# Patient Record
Sex: Female | Born: 2000 | Race: White | Hispanic: No | Marital: Single | Smoking: Never smoker
Health system: Southern US, Community
[De-identification: ages and names within clinical notes are randomized; demographics above are authoritative.]

## PROBLEM LIST (undated history)

## (undated) ENCOUNTER — Inpatient Hospital Stay (HOSPITAL_COMMUNITY): Payer: Self-pay

## (undated) DIAGNOSIS — Z9889 Other specified postprocedural states: Secondary | ICD-10-CM

## (undated) DIAGNOSIS — J45909 Unspecified asthma, uncomplicated: Secondary | ICD-10-CM

## (undated) DIAGNOSIS — R112 Nausea with vomiting, unspecified: Secondary | ICD-10-CM

## (undated) DIAGNOSIS — S0291XA Unspecified fracture of skull, initial encounter for closed fracture: Secondary | ICD-10-CM

## (undated) HISTORY — PX: INNER EAR SURGERY: SHX679

---
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
## 2019-01-08 DIAGNOSIS — Z3482 Encounter for supervision of other normal pregnancy, second trimester: Secondary | ICD-10-CM | POA: Diagnosis not present

## 2019-01-25 DIAGNOSIS — Z363 Encounter for antenatal screening for malformations: Secondary | ICD-10-CM | POA: Diagnosis not present

## 2019-03-21 ENCOUNTER — Encounter (HOSPITAL_COMMUNITY): Payer: Self-pay | Admitting: *Deleted

## 2019-03-21 ENCOUNTER — Inpatient Hospital Stay (HOSPITAL_COMMUNITY)
Admission: AD | Admit: 2019-03-21 | Discharge: 2019-03-21 | Disposition: A | Discharge status: Home / Self Care | Payer: Medicaid Other | Attending: Obstetrics and Gynecology | Admitting: Obstetrics and Gynecology

## 2019-03-21 ENCOUNTER — Other Ambulatory Visit: Payer: Self-pay

## 2019-03-21 DIAGNOSIS — Z3A27 27 weeks gestation of pregnancy: Secondary | ICD-10-CM | POA: Insufficient documentation

## 2019-03-21 DIAGNOSIS — B9689 Other specified bacterial agents as the cause of diseases classified elsewhere: Secondary | ICD-10-CM | POA: Insufficient documentation

## 2019-03-21 DIAGNOSIS — Z0371 Encounter for suspected problem with amniotic cavity and membrane ruled out: Secondary | ICD-10-CM | POA: Diagnosis not present

## 2019-03-21 DIAGNOSIS — O26892 Other specified pregnancy related conditions, second trimester: Secondary | ICD-10-CM | POA: Diagnosis not present

## 2019-03-21 LAB — WET PREP, GENITAL
Sperm: NONE SEEN
Trich, Wet Prep: NONE SEEN
Yeast Wet Prep HPF POC: NONE SEEN

## 2019-03-21 LAB — URINALYSIS, ROUTINE W REFLEX MICROSCOPIC
Bilirubin Urine: NEGATIVE
Glucose, UA: NEGATIVE mg/dL
Hgb urine dipstick: NEGATIVE
Ketones, ur: NEGATIVE mg/dL
Nitrite: NEGATIVE
Protein, ur: NEGATIVE mg/dL
Specific Gravity, Urine: 1.008 (ref 1.005–1.030)
pH: 6 (ref 5.0–8.0)

## 2019-03-21 LAB — POCT FERN TEST: POCT Fern Test: NEGATIVE

## 2019-03-21 LAB — AMNISURE RUPTURE OF MEMBRANE (ROM) NOT AT ARMC: Amnisure ROM: NEGATIVE

## 2019-03-21 MED ORDER — METRONIDAZOLE 500 MG PO TABS: 500.0000 mg | ORAL_TABLET | Freq: Two times a day (BID) | ORAL | 0 refills | Status: DC

## 2019-03-21 NOTE — MAU Provider Note (Signed)
First Provider Initiated Contact with Patient 03/21/19 [phone]     S: [name] is a 18 y.o. G1P0 at [redacted]w[redacted]d  who presents to MAU today complaining of leaking of fluid at 0530. She reports waking up with her bed and pajama pants saturated. She endorses a similar episode last Thursday during which she was riding in the car and noticed that both her pants and the car seat were saturated. She denies ongoing LOF. She denies vaginal bleeding. She denies pain, discomfort, contractions. She reports normal fetal movement.    O: BP 123/63 (BP Location: Right Arm)   Pulse (!) 106   Temp 98.6 F (37 C) (Oral)   Resp 16   Ht 5\' 3"  (1.6 m)   Wt 66.9 kg   SpO2 98%   BMI 26.11 kg/m  GENERAL: Well-developed, well-nourished female in no acute distress.  HEAD: Normocephalic, atraumatic.  CHEST: Normal effort of breathing, regular heart rate ABDOMEN: Soft, nontender, gravid PELVIC: Normal external female genitalia. Vagina is pink and rugated. Cervix with normal contour, no lesions. Thin white vaginal discharge throughout vault.  No gush of fluid with Valsalva, no pooling. Negative fern  Cervical exam: Visually closed on SSE, confirmed with digital exam   Fetal Monitoring: Baseline: 135 Variability: Moderate Accelerations: Positive 15 x 15 Decelerations: None Contractions: Irregular with UI, not palpable, not felt by patient  Results for orders placed or performed during the hospital encounter of 03/21/19 (from the past 24 hour(s))  Urinalysis, Routine w reflex microscopic     Status: Abnormal   Collection Time: 03/21/19  7:37 AM  Result Value Ref Range   Color, Urine YELLOW YELLOW   APPearance HAZY (A) CLEAR   Specific Gravity, Urine 1.008 1.005 - 1.030   pH 6.0 5.0 - 8.0   Glucose, UA NEGATIVE NEGATIVE mg/dL   Hgb urine dipstick NEGATIVE NEGATIVE   Bilirubin Urine NEGATIVE NEGATIVE   Ketones, ur NEGATIVE NEGATIVE mg/dL   Protein, ur NEGATIVE NEGATIVE mg/dL   Nitrite NEGATIVE NEGATIVE    Leukocytes,Ua LARGE (A) NEGATIVE   RBC / HPF 6-10 0 - 5 RBC/hpf   WBC, UA 21-50 0 - 5 WBC/hpf   Bacteria, UA RARE (A) NONE SEEN   Squamous Epithelial / LPF 11-20 0 - 5   Mucus PRESENT   Wet prep, genital     Status: Abnormal   Collection Time: 03/21/19  8:05 AM  Result Value Ref Range   Yeast Wet Prep HPF POC NONE SEEN NONE SEEN   Trich, Wet Prep NONE SEEN NONE SEEN   Clue Cells Wet Prep HPF POC PRESENT (A) NONE SEEN   WBC, Wet Prep HPF POC MANY (A) NONE SEEN   Sperm NONE SEEN   Amnisure rupture of membrane (rom)not at [facility]     Status: None   Collection Time: 03/21/19  8:05 AM  Result Value Ref Range   Amnisure ROM NEGATIVE    Meds ordered this encounter  Medications  . metroNIDAZOLE (FLAGYL) 500 MG tablet    Sig: Take 1 tablet (500 mg total) by mouth 2 (two) times daily.    Dispense:  14 tablet    Refill:  0    Order Specific Question:   Supervising Provider    Answer:   Donnamae Jude [[id number]]   A: [facility] at [redacted]w[redacted]d  Membranes intact (Negative fern, pooling, Amnisure) Cervix closed, pt denies pain throughout evaluation in MAU Bacterial Vaginosis, rx to pharmacy  Urine culture sent for RBCs, elevated WBCs and  Leuks on UA Patient declined offer of IV or PO fluids for toco activity  P:  Discharge home in stable condition with preterm labor precautions  F/U:  Next OB appt is Tuesday 08/25  Clayton BiblesSamantha Jacquelyn Antony, CNM 03/21/2019 9:05 AM

## 2019-03-21 NOTE — MAU Note (Signed)
Amber [name] is a 18 y.o. at [redacted]w[redacted]d here in MAU reporting: woke up in a puddle of water in her bed. Unsure about color, no odor. Has not felt anymore leaking since she got up. No recent IC. No pain. +FM  Onset of complaint: today  Pain score: 0/10  Vitals:   03/21/19 0710  BP: 123/63  Pulse: (!) 106  Resp: 16  Temp: 98.6 F (37 C)  SpO2: 98%     FHT: +FM  Lab orders placed from triage: UA

## 2019-03-21 NOTE — Discharge Instructions (Signed)
Third Trimester of Pregnancy  The third trimester is from week 28 through week 40 (months 7 through 9). This trimester is when your unborn baby (fetus) is growing very fast. At the end of the ninth month, the unborn baby is about 20 inches in length. It weighs about 6-10 pounds. Follow these instructions at home: Medicines  Take over-the-counter and prescription medicines only as told by your doctor. Some medicines are safe and some medicines are not safe during pregnancy.  Take a prenatal vitamin that contains at least 600 micrograms (mcg) of folic acid.  If you have trouble pooping (constipation), take medicine that will make your stool soft (stool softener) if your doctor approves. Eating and drinking   Eat regular, healthy meals.  Avoid raw meat and uncooked cheese.  If you get low calcium from the food you eat, talk to your doctor about taking a daily calcium supplement.  Eat four or five small meals rather than three large meals a day.  Avoid foods that are high in fat and sugars, such as fried and sweet foods.  To prevent constipation: ? Eat foods that are high in fiber, like fresh fruits and vegetables, whole grains, and beans. ? Drink enough fluids to keep your pee (urine) clear or pale yellow. Activity  Exercise only as told by your doctor. Stop exercising if you start to have cramps.  Avoid heavy lifting, wear low heels, and sit up straight.  Do not exercise if it is too hot, too humid, or if you are in a place of great height (high altitude).  You may continue to have sex unless your doctor tells you not to. Relieving pain and discomfort  Wear a good support bra if your breasts are tender.  Take frequent breaks and rest with your legs raised if you have leg cramps or low back pain.  Take warm water baths (sitz baths) to soothe pain or discomfort caused by hemorrhoids. Use hemorrhoid cream if your doctor approves.  If you develop puffy, bulging veins (varicose  veins) in your legs: ? Wear support hose or compression stockings as told by your doctor. ? Raise (elevate) your feet for 15 minutes, 3-4 times a day. ? Limit salt in your food. Safety  Wear your seat belt when driving.  Make a list of emergency phone numbers, including numbers for family, friends, the hospital, and police and fire departments. Preparing for your baby's arrival To prepare for the arrival of your baby:  Take prenatal classes.  Practice driving to the hospital.  Visit the hospital and tour the maternity area.  Talk to your work about taking leave once the baby comes.  Pack your hospital bag.  Prepare the baby's room.  Go to your doctor visits.  Buy a rear-facing car seat. Learn how to install it in your car. General instructions  Do not use hot tubs, steam rooms, or saunas.  Do not use any products that contain nicotine or tobacco, such as cigarettes and e-cigarettes. If you need help quitting, ask your doctor.  Do not drink alcohol.  Do not douche or use tampons or scented sanitary pads.  Do not cross your legs for long periods of time.  Do not travel for long distances unless you must. Only do so if your doctor says it is okay.  Visit your dentist if you have not gone during your pregnancy. Use a soft toothbrush to brush your teeth. Be gentle when you floss.  Avoid cat litter boxes and soil  used by cats. These carry germs that can cause birth defects in the baby and can cause a loss of your baby (miscarriage) or stillbirth.  Keep all your prenatal visits as told by your doctor. This is important. Contact a doctor if:  You are not sure if you are in labor or if your water has broken.  You are dizzy.  You have mild cramps or pressure in your lower belly.  You have a nagging pain in your belly area.  You continue to feel sick to your stomach, you throw up, or you have watery poop.  You have bad smelling fluid coming from your vagina.  You have  pain when you pee. Get help right away if:  You have a fever.  You are leaking fluid from your vagina.  You are spotting or bleeding from your vagina.  You have severe belly cramps or pain.  You lose or gain weight quickly.  You have trouble catching your breath and have chest pain.  You notice sudden or extreme puffiness (swelling) of your face, hands, ankles, feet, or legs.  You have not felt the baby move in over an hour.  You have severe headaches that do not go away with medicine.  You have trouble seeing.  You are leaking, or you are having a gush of fluid, from your vagina before you are 37 weeks.  You have regular belly spasms (contractions) before you are 37 weeks. Summary  The third trimester is from week 28 through week 40 (months 7 through 9). This time is when your unborn baby is growing very fast.  Follow your doctor's advice about medicine, food, and activity.  Get ready for the arrival of your baby by taking prenatal classes, getting all the baby items ready, preparing the baby's room, and visiting your doctor to be checked.  Get help right away if you are bleeding from your vagina, or you have chest pain and trouble catching your breath, or if you have not felt your baby move in over an hour. This information is not intended to replace advice given to you by your health care provider. Make sure you discuss any questions you have with your health care provider. Document Released: 10/09/2009 Document Revised: 11/05/2018 Document Reviewed: 08/20/2016 Elsevier Patient Education  2020 [place].   Bacterial Vaginosis  Bacterial vaginosis is an infection of the vagina. It happens when too many normal germs (healthy bacteria) grow in the vagina. This infection puts you at risk for infections from sex (STIs). Treating this infection can lower your risk for some STIs. You should also treat this if you are pregnant. It can cause your baby to be born early. Follow  these instructions at home: Medicines  Take over-the-counter and prescription medicines only as told by your doctor.  Take or use your antibiotic medicine as told by your doctor. Do not stop taking or using it even if you start to feel better. General instructions  If you your sexual partner is a woman, tell her that you have this infection. She needs to get treatment if she has symptoms. If you have a female partner, he does not need to be treated.  During treatment: ? Avoid sex. ? Do not douche. ? Avoid alcohol as told. ? Avoid breastfeeding as told.  Drink enough fluid to keep your pee (urine) clear or pale yellow.  Keep your vagina and butt (rectum) clean. ? Wash the area with warm water every day. ? Wipe from front  to back after you use the toilet.  Keep all follow-up visits as told by your doctor. This is important. Preventing this condition  Do not douche.  Use only warm water to wash around your vagina.  Use protection when you have sex. This includes: ? Latex condoms. ? Dental dams.  Limit how many people you have sex with. It is best to only have sex with the same person (be monogamous).  Get tested for STIs. Have your partner get tested.  Wear underwear that is cotton or lined with cotton.  Avoid tight pants and pantyhose. This is most important in summer.  Do not use any products that have nicotine or tobacco in them. These include cigarettes and e-cigarettes. If you need help quitting, ask your doctor.  Do not use illegal drugs.  Limit how much alcohol you drink. Contact a doctor if:  Your symptoms do not get better, even after you are treated.  You have more discharge or pain when you pee (urinate).  You have a fever.  You have pain in your belly (abdomen).  You have pain with sex.  Your bleed from your vagina between periods. Summary  This infection happens when too many germs (bacteria) grow in the vagina.  Treating this condition can lower  your risk for some infections from sex (STIs).  You should also treat this if you are pregnant. It can cause early (premature) birth.  Do not stop taking or using your antibiotic medicine even if you start to feel better. This information is not intended to replace advice given to you by your health care provider. Make sure you discuss any questions you have with your health care provider. Document Released: 04/23/2008 Document Revised: 06/27/2017 Document Reviewed: 03/30/2016 Elsevier Patient Education  2020 [employer]

## 2019-03-22 ENCOUNTER — Inpatient Hospital Stay (HOSPITAL_COMMUNITY)
Admission: AD | Admit: 2019-03-22 | Discharge: 2019-03-22 | Disposition: A | Discharge status: Home / Self Care | Payer: Medicaid Other | Attending: Obstetrics and Gynecology | Admitting: Obstetrics and Gynecology

## 2019-03-22 ENCOUNTER — Encounter (HOSPITAL_COMMUNITY): Payer: Self-pay

## 2019-03-22 ENCOUNTER — Other Ambulatory Visit: Payer: Self-pay

## 2019-03-22 DIAGNOSIS — O9989 Other specified diseases and conditions complicating pregnancy, childbirth and the puerperium: Secondary | ICD-10-CM | POA: Diagnosis not present

## 2019-03-22 DIAGNOSIS — M549 Dorsalgia, unspecified: Secondary | ICD-10-CM | POA: Diagnosis not present

## 2019-03-22 DIAGNOSIS — Z3A27 27 weeks gestation of pregnancy: Secondary | ICD-10-CM | POA: Diagnosis not present

## 2019-03-22 DIAGNOSIS — J45909 Unspecified asthma, uncomplicated: Secondary | ICD-10-CM | POA: Diagnosis not present

## 2019-03-22 DIAGNOSIS — O99512 Diseases of the respiratory system complicating pregnancy, second trimester: Secondary | ICD-10-CM | POA: Insufficient documentation

## 2019-03-22 DIAGNOSIS — O26892 Other specified pregnancy related conditions, second trimester: Secondary | ICD-10-CM | POA: Diagnosis not present

## 2019-03-22 DIAGNOSIS — M545 Low back pain: Secondary | ICD-10-CM | POA: Insufficient documentation

## 2019-03-22 LAB — CULTURE, OB URINE: Culture: 40000 — AB

## 2019-03-22 MED ORDER — CYCLOBENZAPRINE HCL 5 MG PO TABS: 5.0000 mg | ORAL_TABLET | Freq: Three times a day (TID) | ORAL | 0 refills | Status: DC | PRN

## 2019-03-22 NOTE — MAU Note (Signed)
Pt states since yesterday afternoon she has had constant back pain, no bleeding, good FM Marilynne Drivers, RN

## 2019-03-22 NOTE — Discharge Instructions (Signed)

## 2019-03-22 NOTE — MAU Provider Note (Signed)
Chief Complaint:  Back Pain   First Provider Initiated Contact with Patient 03/22/19 1211     HPI: Amber [name] is a 18 y.o. G1P0 at 7353w4d who presents to maternity admissions reporting back pain. Symptoms started yesterday after leaving MAU. Reports constant pain in her right lower back. Nothing makes better or worse. Denies abdominal pain, dysuria, hematuria, n/v, fever/chills, vaginal bleeding, or LOF. Was evaluated for r/o SROM yesterday & found to be not ruptured. Denies leaking since then. Good fetal movement.  [facility]. Denies complications with this pregnancy.   Location: back Quality: aching Severity: 6/10 in pain scale Duration: 1 day Timing: constant Modifying factors: nothing makes worse. Took tylenol without relief Associated signs and symptoms: none   Past Medical History:  Diagnosis Date  . Asthma   . Skull fracture (HCC)    at 116 months old   OB History  Gravida Para Term Preterm AB Living  1            SAB TAB Ectopic Multiple Live Births               # Outcome Date GA Lbr Len/2nd Weight Sex Delivery Anes PTL Lv  1 Current            Past Surgical History:  Procedure Laterality Date  . INNER EAR SURGERY Left    Family History  Problem Relation Age of Onset  . Diabetes Mother    Social History   Tobacco Use  . Smoking status: Never Smoker  . Smokeless tobacco: Never Used  Substance Use Topics  . Alcohol use: Never    Frequency: Never  . Drug use: Never   No Known Allergies No medications prior to admission.    I have reviewed patient's Past Medical Hx, Surgical Hx, Family Hx, Social Hx, medications and allergies.   ROS:  Review of Systems  Constitutional: Negative.   Gastrointestinal: Negative.   Genitourinary: Negative.   Musculoskeletal: Positive for back pain.    Physical Exam   Patient Vitals for the past 24 hrs:  BP Temp Temp src Pulse Resp SpO2  03/22/19 1226 115/76 - - 99 16 -  03/22/19  1130 114/72 98.2 F (36.8 C) Oral 84 16 100 %    Constitutional: Well-developed, well-nourished female in no acute distress.  Cardiovascular: normal rate & rhythm, no murmur Respiratory: normal effort, lung sounds clear throughout GI: Abd soft, non-tender, gravid appropriate for gestational age. Pos BS x 4. No CVAT. MS: Extremities nontender, no edema, normal ROM Neurologic: Alert and oriented x 4.  GU:    Dilation: Closed Effacement (%): Thick Cervical Position: Posterior Exam by:: Judeth HornErin Vidit Boissonneault, NP  NST:  Baseline: 140 bpm, Variability: Good {> 6 bpm), Accelerations: Reactive and Decelerations: Absent   Labs: No results found for this or any previous visit (from the past 24 hour(s)).  Imaging:  No results found.  MAU Course: Orders Placed This Encounter  Procedures  . Discharge patient   Meds ordered this encounter  Medications  . cyclobenzaprine (FLEXERIL) 5 MG tablet    Sig: Take 1 tablet (5 mg total) by mouth 3 (three) times daily as needed for muscle spasms.    Dispense:  15 tablet    Refill:  0    Order Specific Question:   Supervising Provider    Answer:   CONSTANT, PEGGY [[id number]]    MDM: Category 1 tracing. Some UI on monitor. Abdomen soft & non tender. Cervix  closed/thick/firm  Pt with right sided low back pain & no other symptoms. No CVAT. Had urine culture done negative which only showed some lactobacillus. Has appointment in office tomorrow. Will give small rx of flexeril. Discussed reasons to return to MAU and encouraged to keep OB appt tomorrow.   Assessment: 1. Back pain affecting pregnancy in second trimester   2. [redacted] weeks gestation of pregnancy     Plan: Discharge home in stable condition.  Preterm Labor precautions and fetal kick counts Rx flexeril  Follow-up Information    Ob/Gyn, [facility] Follow up.   Why: keep scheduled appointment Contact information: [place] [address] [phone]        [facility] Follow up.   Specialty: Obstetrics and Gynecology Why: return for worsening symptoms Contact information: [address] [id number] [place] [phone]          Allergies as of 03/22/2019   No Known Allergies     Medication List    STOP taking these medications   BONJESTA PO   dicyclomine 10 MG capsule Commonly known as: Bentyl   promethazine 12.5 MG tablet Commonly known as: PHENERGAN   ranitidine 150 MG tablet Commonly known as: ZANTAC     TAKE these medications   cyclobenzaprine 5 MG tablet Commonly known as: FLEXERIL Take 1 tablet (5 mg total) by mouth 3 (three) times daily as needed for muscle spasms.   metroNIDAZOLE 500 MG tablet Commonly known as: Flagyl Take 1 tablet (500 mg total) by mouth 2 (two) times daily.   prenatal vitamin w/FE, FA 27-1 MG Tabs tablet Take 1 tablet by mouth daily at 12 noon.       Jorje Guild, NP 03/22/2019 2:49 PM

## 2019-03-23 DIAGNOSIS — Z23 Encounter for immunization: Secondary | ICD-10-CM | POA: Diagnosis not present

## 2019-03-23 DIAGNOSIS — Z3480 Encounter for supervision of other normal pregnancy, unspecified trimester: Secondary | ICD-10-CM | POA: Diagnosis not present

## 2019-03-23 LAB — OB RESULTS CONSOLE RPR: RPR: NONREACTIVE

## 2019-03-26 DIAGNOSIS — H9190 Unspecified hearing loss, unspecified ear: Secondary | ICD-10-CM | POA: Diagnosis not present

## 2019-04-03 DIAGNOSIS — G44209 Tension-type headache, unspecified, not intractable: Secondary | ICD-10-CM | POA: Diagnosis not present

## 2019-04-03 DIAGNOSIS — H40033 Anatomical narrow angle, bilateral: Secondary | ICD-10-CM | POA: Diagnosis not present

## 2019-04-04 DIAGNOSIS — H5213 Myopia, bilateral: Secondary | ICD-10-CM | POA: Diagnosis not present

## 2019-04-30 ENCOUNTER — Other Ambulatory Visit: Payer: Self-pay

## 2019-04-30 ENCOUNTER — Ambulatory Visit (INDEPENDENT_AMBULATORY_CARE_PROVIDER_SITE_OTHER): Payer: Self-pay | Admitting: Pediatrics

## 2019-04-30 ENCOUNTER — Encounter: Payer: Self-pay | Admitting: Pediatrics

## 2019-04-30 DIAGNOSIS — Z7681 Expectant parent(s) prebirth pediatrician visit: Secondary | ICD-10-CM | POA: Insufficient documentation

## 2019-04-30 NOTE — Progress Notes (Signed)
Prenatal counseling for impending newborn done. No high risk issues during pregnancy. Doing well.

## 2019-05-13 DIAGNOSIS — O26899 Other specified pregnancy related conditions, unspecified trimester: Secondary | ICD-10-CM | POA: Diagnosis not present

## 2019-05-14 ENCOUNTER — Inpatient Hospital Stay (HOSPITAL_COMMUNITY): Payer: Medicaid Other | Admitting: Anesthesiology

## 2019-05-14 ENCOUNTER — Encounter (HOSPITAL_COMMUNITY): Payer: Self-pay

## 2019-05-14 ENCOUNTER — Other Ambulatory Visit: Payer: Self-pay

## 2019-05-14 ENCOUNTER — Inpatient Hospital Stay (HOSPITAL_COMMUNITY)
Admission: AD | Admit: 2019-05-14 | Discharge: 2019-05-16 | DRG: 806 | Disposition: A | Discharge status: Home / Self Care | Payer: Medicaid Other | Attending: Obstetrics & Gynecology | Admitting: Obstetrics & Gynecology

## 2019-05-14 DIAGNOSIS — O1404 Mild to moderate pre-eclampsia, complicating childbirth: Secondary | ICD-10-CM | POA: Diagnosis present

## 2019-05-14 DIAGNOSIS — Z3A35 35 weeks gestation of pregnancy: Secondary | ICD-10-CM

## 2019-05-14 DIAGNOSIS — O26893 Other specified pregnancy related conditions, third trimester: Secondary | ICD-10-CM | POA: Diagnosis present

## 2019-05-14 DIAGNOSIS — D62 Acute posthemorrhagic anemia: Secondary | ICD-10-CM | POA: Diagnosis not present

## 2019-05-14 DIAGNOSIS — O42913 Preterm premature rupture of membranes, unspecified as to length of time between rupture and onset of labor, third trimester: Secondary | ICD-10-CM | POA: Diagnosis not present

## 2019-05-14 DIAGNOSIS — O9081 Anemia of the puerperium: Secondary | ICD-10-CM | POA: Diagnosis not present

## 2019-05-14 DIAGNOSIS — Z20828 Contact with and (suspected) exposure to other viral communicable diseases: Secondary | ICD-10-CM | POA: Diagnosis present

## 2019-05-14 DIAGNOSIS — O1490 Unspecified pre-eclampsia, unspecified trimester: Secondary | ICD-10-CM

## 2019-05-14 HISTORY — DX: Other specified postprocedural states: Z98.890

## 2019-05-14 HISTORY — DX: Nausea with vomiting, unspecified: R11.2

## 2019-05-14 LAB — POCT FERN TEST: POCT Fern Test: POSITIVE

## 2019-05-14 LAB — COMPREHENSIVE METABOLIC PANEL
ALT: 17 U/L (ref 0–44)
AST: 17 U/L (ref 15–41)
Albumin: 3 g/dL — ABNORMAL LOW (ref 3.5–5.0)
Alkaline Phosphatase: 288 U/L — ABNORMAL HIGH (ref 38–126)
Anion gap: 12 (ref 5–15)
BUN: 7 mg/dL (ref 6–20)
CO2: 18 mmol/L — ABNORMAL LOW (ref 22–32)
Calcium: 9.3 mg/dL (ref 8.9–10.3)
Chloride: 105 mmol/L (ref 98–111)
Creatinine, Ser: 0.58 mg/dL (ref 0.44–1.00)
GFR calc Af Amer: >60 mL/min (ref >60)
GFR calc non Af Amer: >60 mL/min (ref >60)
Glucose, Bld: 73 mg/dL (ref 70–99)
Potassium: 4.2 mmol/L (ref 3.5–5.1)
Sodium: 135 mmol/L (ref 135–145)
Total Bilirubin: 0.8 mg/dL (ref 0.3–1.2)
Total Protein: 6.6 g/dL (ref 6.5–8.1)

## 2019-05-14 LAB — CBC
HCT: 36.9 % (ref 36.0–46.0)
Hemoglobin: 12.5 g/dL (ref 12.0–15.0)
MCH: 28.6 pg (ref 26.0–34.0)
MCHC: 33.9 g/dL (ref 30.0–36.0)
MCV: 84.4 fL (ref 80.0–100.0)
Platelets: 294 10*3/uL (ref 150–400)
RBC: 4.37 MIL/uL (ref 3.87–5.11)
RDW: 11.9 % (ref 11.5–15.5)
WBC: 11.8 10*3/uL — ABNORMAL HIGH (ref 4.0–10.5)
nRBC: 0 % (ref 0.0–0.2)

## 2019-05-14 LAB — TYPE AND SCREEN
ABO/RH(D): A POS
Antibody Screen: NEGATIVE

## 2019-05-14 LAB — PROTEIN / CREATININE RATIO, URINE
Creatinine, Urine: 42.86 mg/dL
Protein Creatinine Ratio: 1.26 mg/mg{Cre} — ABNORMAL HIGH (ref 0.00–0.15)
Total Protein, Urine: 54 mg/dL

## 2019-05-14 LAB — URIC ACID: Uric Acid, Serum: 5.3 mg/dL (ref 2.5–7.1)

## 2019-05-14 LAB — LACTATE DEHYDROGENASE: LDH: 170 U/L (ref 98–192)

## 2019-05-14 LAB — HIV ANTIBODY (ROUTINE TESTING W REFLEX): HIV Screen 4th Generation wRfx: NONREACTIVE

## 2019-05-14 LAB — SARS CORONAVIRUS 2 BY RT PCR (HOSPITAL ORDER, PERFORMED IN ~~LOC~~ HOSPITAL LAB): SARS Coronavirus 2: NEGATIVE

## 2019-05-14 MED ORDER — FENTANYL CITRATE (PF) 100 MCG/2ML IJ SOLN: 50.0000 ug | INTRAMUSCULAR | Status: DC | PRN

## 2019-05-14 MED ORDER — LIDOCAINE HCL (PF) 1 % IJ SOLN: 30.0000 mL | INTRAMUSCULAR | Status: DC | PRN

## 2019-05-14 MED ORDER — OXYTOCIN BOLUS FROM INFUSION: 500.0000 mL | Freq: Once | INTRAVENOUS | Status: DC

## 2019-05-14 MED ORDER — ONDANSETRON HCL 4 MG/2ML IJ SOLN
4.0000 mg | Freq: Four times a day (QID) | INTRAMUSCULAR | Status: DC | PRN
  Administered 2019-05-14: 4 mg via INTRAVENOUS
  Filled 2019-05-14: qty 2

## 2019-05-14 MED ORDER — DIBUCAINE (PERIANAL) 1 % EX OINT: 1.0000 "application " | TOPICAL_OINTMENT | CUTANEOUS | Status: DC | PRN

## 2019-05-14 MED ORDER — OXYTOCIN 40 UNITS IN NORMAL SALINE INFUSION - SIMPLE MED
2.5000 [IU]/h | INTRAVENOUS | Status: DC
  Filled 2019-05-14: qty 1000

## 2019-05-14 MED ORDER — LACTATED RINGERS IV SOLN: 500.0000 mL | INTRAVENOUS | Status: DC | PRN

## 2019-05-14 MED ORDER — FENTANYL-BUPIVACAINE-NACL 0.5-0.125-0.9 MG/250ML-% EP SOLN
12.0000 mL/h | EPIDURAL | Status: DC | PRN
  Filled 2019-05-14: qty 250

## 2019-05-14 MED ORDER — SODIUM CHLORIDE 0.9 % IV SOLN
5.0000 10*6.[IU] | Freq: Once | INTRAVENOUS | Status: AC
  Administered 2019-05-14: 5 10*6.[IU] via INTRAVENOUS
  Filled 2019-05-14: qty 5

## 2019-05-14 MED ORDER — DIPHENHYDRAMINE HCL 50 MG/ML IJ SOLN: 12.5000 mg | INTRAMUSCULAR | Status: DC | PRN

## 2019-05-14 MED ORDER — ACETAMINOPHEN 325 MG PO TABS: 650.0000 mg | ORAL_TABLET | ORAL | Status: DC | PRN

## 2019-05-14 MED ORDER — SIMETHICONE 80 MG PO CHEW: 80.0000 mg | CHEWABLE_TABLET | ORAL | Status: DC | PRN

## 2019-05-14 MED ORDER — PRENATAL MULTIVITAMIN CH
1.0000 | ORAL_TABLET | Freq: Every day | ORAL | Status: DC
  Administered 2019-05-15 – 2019-05-16 (×2): 1 via ORAL
  Filled 2019-05-14: qty 1

## 2019-05-14 MED ORDER — COCONUT OIL OIL: 1.0000 "application " | TOPICAL_OIL | Status: DC | PRN

## 2019-05-14 MED ORDER — LACTATED RINGERS IV SOLN
500.0000 mL | Freq: Once | INTRAVENOUS | Status: AC
  Administered 2019-05-14: 500 mL via INTRAVENOUS

## 2019-05-14 MED ORDER — OXYCODONE-ACETAMINOPHEN 5-325 MG PO TABS: 2.0000 | ORAL_TABLET | ORAL | Status: DC | PRN

## 2019-05-14 MED ORDER — EPHEDRINE 5 MG/ML INJ: 10.0000 mg | INTRAVENOUS | Status: DC | PRN

## 2019-05-14 MED ORDER — LACTATED RINGERS IV SOLN
INTRAVENOUS | Status: DC
  Administered 2019-05-14 (×2): via INTRAVENOUS

## 2019-05-14 MED ORDER — TERBUTALINE SULFATE 1 MG/ML IJ SOLN: 0.2500 mg | Freq: Once | INTRAMUSCULAR | Status: DC | PRN

## 2019-05-14 MED ORDER — SENNOSIDES-DOCUSATE SODIUM 8.6-50 MG PO TABS
2.0000 | ORAL_TABLET | ORAL | Status: DC
  Administered 2019-05-14 – 2019-05-15 (×2): 2 via ORAL
  Filled 2019-05-14 (×2): qty 2

## 2019-05-14 MED ORDER — SOD CITRATE-CITRIC ACID 500-334 MG/5ML PO SOLN: 30.0000 mL | ORAL | Status: DC | PRN

## 2019-05-14 MED ORDER — PHENYLEPHRINE 40 MCG/ML (10ML) SYRINGE FOR IV PUSH (FOR BLOOD PRESSURE SUPPORT)
80.0000 ug | PREFILLED_SYRINGE | INTRAVENOUS | Status: DC | PRN
  Filled 2019-05-14: qty 10

## 2019-05-14 MED ORDER — ONDANSETRON HCL 4 MG/2ML IJ SOLN: 4.0000 mg | INTRAMUSCULAR | Status: DC | PRN

## 2019-05-14 MED ORDER — BENZOCAINE-MENTHOL 20-0.5 % EX AERO
1.0000 "application " | INHALATION_SPRAY | CUTANEOUS | Status: DC | PRN
  Administered 2019-05-15: 1 via TOPICAL
  Filled 2019-05-14: qty 56

## 2019-05-14 MED ORDER — LIDOCAINE HCL (PF) 1 % IJ SOLN
INTRAMUSCULAR | Status: DC | PRN
  Administered 2019-05-14: 5 mL via EPIDURAL
  Administered 2019-05-14: 4 mL via EPIDURAL

## 2019-05-14 MED ORDER — OXYCODONE-ACETAMINOPHEN 5-325 MG PO TABS: 1.0000 | ORAL_TABLET | ORAL | Status: DC | PRN

## 2019-05-14 MED ORDER — DIPHENHYDRAMINE HCL 25 MG PO CAPS
25.0000 mg | ORAL_CAPSULE | Freq: Four times a day (QID) | ORAL | Status: DC | PRN
  Administered 2019-05-15: 25 mg via ORAL
  Filled 2019-05-14: qty 1

## 2019-05-14 MED ORDER — BETAMETHASONE SOD PHOS & ACET 6 (3-3) MG/ML IJ SUSP
12.0000 mg | Freq: Once | INTRAMUSCULAR | Status: AC
  Administered 2019-05-14: 14:00:00 12 mg via INTRAMUSCULAR
  Filled 2019-05-14: qty 2

## 2019-05-14 MED ORDER — SODIUM CHLORIDE (PF) 0.9 % IJ SOLN
INTRAMUSCULAR | Status: DC | PRN
  Administered 2019-05-14: 12 mL/h via EPIDURAL

## 2019-05-14 MED ORDER — OXYTOCIN 40 UNITS IN NORMAL SALINE INFUSION - SIMPLE MED
1.0000 m[IU]/min | INTRAVENOUS | Status: DC
  Administered 2019-05-14: 2 m[IU]/min via INTRAVENOUS

## 2019-05-14 MED ORDER — IBUPROFEN 600 MG PO TABS
600.0000 mg | ORAL_TABLET | Freq: Four times a day (QID) | ORAL | Status: DC
  Administered 2019-05-14 – 2019-05-16 (×7): 600 mg via ORAL
  Filled 2019-05-14 (×6): qty 1

## 2019-05-14 MED ORDER — PHENYLEPHRINE 40 MCG/ML (10ML) SYRINGE FOR IV PUSH (FOR BLOOD PRESSURE SUPPORT): 80.0000 ug | PREFILLED_SYRINGE | INTRAVENOUS | Status: DC | PRN

## 2019-05-14 MED ORDER — ONDANSETRON HCL 4 MG PO TABS: 4.0000 mg | ORAL_TABLET | ORAL | Status: DC | PRN

## 2019-05-14 MED ORDER — WITCH HAZEL-GLYCERIN EX PADS: 1.0000 "application " | MEDICATED_PAD | CUTANEOUS | Status: DC | PRN

## 2019-05-14 MED ORDER — PENICILLIN G 3 MILLION UNITS IVPB - SIMPLE MED
3.0000 10*6.[IU] | INTRAVENOUS | Status: DC
  Administered 2019-05-14: 3 10*6.[IU] via INTRAVENOUS
  Filled 2019-05-14: qty 100

## 2019-05-14 NOTE — MAU Provider Note (Signed)
None      S: Amber [name] is a 18 y.o. G1P0 at [redacted]w[redacted]d  who presents to MAU today complaining of leaking of fluid since 10 am. She denies vaginal bleeding. She endorses painful contractions every 5-6 minutes. She reports normal fetal movement.    O: BP (!) 141/84   Pulse 89   Temp 98.1 F (36.7 C) (Oral)   Resp 16   SpO2 98%  GENERAL: Well-developed, well-nourished female in no acute distress.  HEAD: Normocephalic, atraumatic.  CHEST: Normal effort of breathing, regular heart rate ABDOMEN: Soft, nontender, gravid PELVIC: moderate amount of clear fluid coming from introitus. SSE deferred. Digital exam performed due to painful ctx Q2-3 mins   Cervical exam:  Dilation: 2.5 Effacement (%): 70 Cervical Position: Middle Station: -2 Presentation: Vertex Exam by:: Jorje Guild NP   Fetal Monitoring: Baseline: 140 Variability: moderate Accelerations: 15x15 Decelerations: none Contractions: Q2-3 mins  Results for orders placed or performed during the hospital encounter of 05/14/19 (from the past 24 hour(s))  POCT fern test     Status: Abnormal   Collection Time: 05/14/19 11:40 AM  Result Value Ref Range   POCT Fern Test Positive = ruptured amniotic membanes      A: [facility] at [redacted]w[redacted]d  SROM  P: Admit to birthing suites GBS culture collected in MAU Elevated BPs, pt asymptomatic & denies hx, PEC labs on admission Dr. Wendi Snipes notified & will place admission orders  Jorje Guild, NP 05/14/2019 11:47 AM

## 2019-05-14 NOTE — Anesthesia Preprocedure Evaluation (Signed)
Anesthesia Evaluation  Patient identified by MRN, date of birth, ID band Patient awake    Reviewed: Allergy & Precautions, NPO status , Patient's Chart, lab work & pertinent test results  History of Anesthesia Complications (+) PONV and history of anesthetic complications  Airway Mallampati: II   Neck ROM: Full    Dental   Pulmonary asthma ,    Pulmonary exam normal        Cardiovascular negative cardio ROS Normal cardiovascular exam     Neuro/Psych negative neurological ROS  negative psych ROS   GI/Hepatic negative GI ROS, Neg liver ROS,   Endo/Other  negative endocrine ROS  Renal/GU negative Renal ROS     Musculoskeletal negative musculoskeletal ROS (+)   Abdominal   Peds  Hematology negative hematology ROS (+)  Plt 294k    Anesthesia Other Findings   Reproductive/Obstetrics                             Anesthesia Physical Anesthesia Plan  ASA: II  Anesthesia Plan: Epidural   Post-op Pain Management:    Induction:   PONV Risk Score and Plan: 3 and Treatment may vary due to age or medical condition  Airway Management Planned: Natural Airway  Additional Equipment: None  Intra-op Plan:   Post-operative Plan:   Informed Consent: I have reviewed the patients History and Physical, chart, labs and discussed the procedure including the risks, benefits and alternatives for the proposed anesthesia with the patient or authorized representative who has indicated his/her understanding and acceptance.       Plan Discussed with: Anesthesiologist  Anesthesia Plan Comments: (Labs reviewed. Platelets acceptable, patient not taking any blood thinning medications. Per RN, FHR tracing reported to be stable enough for sitting procedure. Risks and benefits discussed with patient, including PDPH, backache, epidural hematoma, failed epidural, blood pressure changes, allergic reaction, and  nerve injury. Patient expressed understanding and wished to proceed.)        Anesthesia Quick Evaluation

## 2019-05-14 NOTE — Anesthesia Procedure Notes (Signed)
Epidural Patient location during procedure: OB Start time: 05/14/2019 4:48 PM End time: 05/14/2019 4:51 PM  Staffing Anesthesiologist: Audry Pili, MD Performed: anesthesiologist   Preanesthetic Checklist Completed: patient identified, pre-op evaluation, timeout performed, IV checked, risks and benefits discussed and monitors and equipment checked  Epidural Patient position: sitting Prep: DuraPrep Patient monitoring: continuous pulse ox and blood pressure Approach: midline Location: L2-L3 Injection technique: LOR saline  Needle:  Needle type: Tuohy  Needle gauge: 17 G Needle length: 9 cm Needle insertion depth: 5 cm Catheter size: 19 Gauge Catheter at skin depth: 10 cm Test dose: negative and Other (1% lidocaine)  Assessment Events: blood not aspirated  Additional Notes Patient identified. Risks including, but not limited to, bleeding, infection, nerve damage, paralysis, inadequate analgesia, blood pressure changes, nausea, vomiting, allergic reaction, postpartum back pain, itching, and headache were discussed. Patient expressed understanding and wished to proceed. Sterile prep and drape, including hand hygiene, mask, and sterile gloves were used. The patient was positioned and the spine was prepped. The skin was anesthetized with lidocaine. No paraesthesia or other complication noted. The patient did not experience any signs of intravascular injection such as tinnitus or metallic taste in mouth, nor signs of intrathecal spread such as rapid motor block. Please see nursing notes for vital signs. The patient tolerated the procedure well.   Amber [name], MDReason for block:procedure for pain

## 2019-05-14 NOTE — MAU Note (Signed)
Amber [name] is a 18 y.o. at [redacted]w[redacted]d here in MAU reporting: LOF since about 10, had a big gush and now she feels some LOF when she changes position, fluid is clear. Lost her mucus plug and saw some blood in that. Having ctx in her back, started after the LOF. States decreased FM over the past couple of days.  Onset of complaint:  Today  Pain score: 7/10  Vitals:   05/14/19 1125  BP: 139/84  Pulse: (!) 109  Resp: 16  Temp: 98.1 F (36.7 C)  SpO2: 98%     Lab orders placed from triage: none

## 2019-05-14 NOTE — H&P (Addendum)
Amber [name] is a 18 y.o. female  G1P0 at 101w1d presented with concern for LOF since 10am and contractions. Denies vaginal bleeding, reports normal FM.   OB History    Gravida  1   Para      Term      Preterm      AB      Living        SAB      TAB      Ectopic      Multiple      Live Births             Past Medical History:  Diagnosis Date  . Asthma   . Skull fracture (HCC)    at 68 months old   Past Surgical History:  Procedure Laterality Date  . INNER EAR SURGERY Left    Family History: family history includes Diabetes in her mother. Social History:  reports that she has never smoked. She has never used smokeless tobacco. She reports that she does not drink alcohol or use drugs.     Maternal Diabetes: No Genetic Screening: Normal panorama Maternal Ultrasounds/Referrals: Normal anatomy, anterior placenta Fetal Ultrasounds or other Referrals:  None Maternal Substance Abuse:  No Significant Maternal Medications:  None Significant Maternal Lab Results:  None Other Comments: PUPPPS Exam Physical Exam  Dilation: 2.5 Effacement (%): 70 Station: -2 Exam by:: Jorje Guild NP Blood pressure (!) 141/84, pulse 89, temperature 98.1 F (36.7 C), temperature source Oral, resp. rate 16, SpO2 98 %.  Fetal testing: FHR 140, Cat 1 Toco q2-61m  Prenatal labs: ABO, Rh: --/--/A POS (05/18 1849) Antibody:   Rubella: Nonimmune (05/15 0000) RPR: Nonreactive (08/25 0000)  HBsAg: Negative (05/15 0000)  HIV: Non-reactive, Non-reactive (05/15 0000)  GBS:   Unknown, collected on arrival  Assessment/Plan: Amber [name] 18 y.o. G1P0 at [redacted]w[redacted]d admitted with labor/srom 1. Labor/SROM: allow to spontaneously labor, if unchanged will add pitocin GBS unknown, ordered PCN 2. Elevated BP: mild in MAU, pree labs ordered, cont to monitor 3. Late preterm: will give BMZ, defer NICU consult at this time Karthikeya Funke K Taam-Akelman 05/14/2019, 11:55 AM

## 2019-05-15 DIAGNOSIS — O1490 Unspecified pre-eclampsia, unspecified trimester: Secondary | ICD-10-CM

## 2019-05-15 LAB — RPR: RPR Ser Ql: NONREACTIVE

## 2019-05-15 LAB — CBC
HCT: 31.4 % — ABNORMAL LOW (ref 36.0–46.0)
Hemoglobin: 10.6 g/dL — ABNORMAL LOW (ref 12.0–15.0)
MCH: 29 pg (ref 26.0–34.0)
MCHC: 33.8 g/dL (ref 30.0–36.0)
MCV: 85.8 fL (ref 80.0–100.0)
Platelets: 270 10*3/uL (ref 150–400)
RBC: 3.66 MIL/uL — ABNORMAL LOW (ref 3.87–5.11)
RDW: 12.2 % (ref 11.5–15.5)
WBC: 17.9 10*3/uL — ABNORMAL HIGH (ref 4.0–10.5)
nRBC: 0 % (ref 0.0–0.2)

## 2019-05-15 MED ORDER — MEASLES, MUMPS & RUBELLA VAC IJ SOLR
0.5000 mL | Freq: Once | INTRAMUSCULAR | Status: AC
  Administered 2019-05-16: 0.5 mL via SUBCUTANEOUS
  Filled 2019-05-15: qty 0.5

## 2019-05-15 NOTE — Anesthesia Postprocedure Evaluation (Signed)
Anesthesia Post Note  Patient: Amber [name]  Procedure(s) Performed: AN AD HOC LABOR EPIDURAL     Patient location during evaluation: Mother Baby Anesthesia Type: Epidural Level of consciousness: awake and alert, oriented and patient cooperative Pain management: pain level controlled Vital Signs Assessment: post-procedure vital signs reviewed and stable Respiratory status: spontaneous breathing Cardiovascular status: stable Postop Assessment: no headache, epidural receding, no signs of nausea or vomiting and able to ambulate Anesthetic complications: no Comments: Pt. States she is walking.  Pain score 4.  Encouraged to communicate pain needs to bedside RN.     Last Vitals:  Vitals:   05/14/19 2330 05/15/19 0339  BP: 115/62 (!) 100/55  Pulse: (!) 103 87  Resp: 18 16  Temp: 36.7 C 36.8 C  SpO2: 98% 98%    Last Pain:  Vitals:   05/15/19 0624  TempSrc:   PainSc: 0-No pain   Pain Goal:                   [facility]

## 2019-05-15 NOTE — Lactation Note (Addendum)
This note was copied from a baby's chart. Lactation Consultation Note Baby 11 hrs old. Will not stay latched to breast. Mom has full heavy round breast w/small areolas and small flat nipples. Hardly any shaft when stimulated w/pump or expression. Breast isn't compressible d/t fullness. Breast massaged, reverse pressure to soften tissue. Hand expression taught. Once colostrum started, it poured collecting 5 ml rich colostrum. praised mom.  Mom has DEBP at home. Mom has DEBP at bedside. Encouraged to pump Q 3 hrs. Mom has used hand pump collecting small amount of colostrum. Milk storage discussed. There is no way at this time baby can latch to breast. Fitted and taught application of #96 NS. Mom demonstrated application. Shells given and encouraged hand pump for pre-pumping prior to NS application. Attempted to latch baby but had no interest. LC taught football hold as well as positioning w/boppy and pillows. LPI information sheet reviewed some about feeding, supplementing, and behavior of 35 week baby. Discussed importance of STS and I&O. Answered questions. MGM at bedside as support person.  Mom stated baby has spit up formula after feeding. Discussed baby's spit up, not to lay baby down right after feeding. Praised mom for a good supply of colostrum. Encouraged to give before formula. Spoon given to stimulate baby to feed every 3-4 hrs. Discussed how LPI are different and may not want to feed in 3 hrs. Encouraged to read LPI sheet well. Mom is supplementing w/Gerber d/t wt. 6.1 at birth. Encouraged to call for assistance or questions. Lactation brochure given.  Patient Name: Boy Jakyah Bradby VELFY'B Date: 05/15/2019 Reason for consult: Initial assessment;Primapara;Late-preterm 34-36.6wks   Maternal Data Has patient been taught Hand Expression?: Yes Does the patient have breastfeeding experience prior to this delivery?: No  Feeding Feeding Type: Breast Milk with Formula  added Nipple Type: Slow - flow  LATCH Score Latch: Too sleepy or reluctant, no latch achieved, no sucking elicited.  Audible Swallowing: None  Type of Nipple: Flat  Comfort (Breast/Nipple): Filling, red/small blisters or bruises, mild/mod discomfort(breast filling/full)  Hold (Positioning): Full assist, staff holds infant at breast  LATCH Score: 2  Interventions Interventions: Breast feeding basics reviewed;Adjust position;DEBP;Assisted with latch;Support pillows;Skin to skin;Position options;Breast massage;Expressed milk;Hand express;Pre-pump if needed;Shells;Reverse pressure;Breast compression;Hand pump  Lactation Tools Discussed/Used Tools: Shells;Pump;Nipple Shields Nipple shield size: 20 Shell Type: Inverted Breast pump type: Double-Electric Breast Pump;Manual WIC Program: Yes Pump Review: Setup, frequency, and cleaning;Milk Storage Initiated by:: RN Date initiated:: 05/14/19   Consult Status Consult Status: Follow-up Date: 05/15/19(in pm) Follow-up type: In-patient    Theodoro Kalata 05/15/2019, 6:32 AM

## 2019-05-15 NOTE — Progress Notes (Signed)
Post Partum Day 1 Subjective: no complaints, up ad lib, voiding and tolerating PO  Objective: Vitals:   05/14/19 2230 05/14/19 2330 05/15/19 0339 05/15/19 0730  BP: (!) 115/56 115/62 (!) 100/55 105/74  Pulse: 86 (!) 103 87 75  Resp: 18 18 16 16   Temp: 98.9 F (37.2 C) 98.1 F (36.7 C) 98.2 F (36.8 C) 98 F (36.7 C)  TempSrc: Oral Oral Oral Oral  SpO2: 98% 98% 98% 99%  Weight:      Height:        Physical Exam:  General: alert and no distress Lochia: appropriate Uterine Fundus: firm DVT Evaluation: No evidence of DVT seen on physical exam.  Recent Labs    05/14/19 1201 05/15/19 0334  HGB 12.5 10.6*  HCT 36.9 31.4*    Assessment/Plan: Amber [name] 18 y.o. G1P0101 PPD#1 sp SVD at [redacted]w[redacted]d  1. PPC: 1st degree, doing well 2. Pree: Diagnosed on L&D, UPC 1.2, CBC/CMP wnl.  3. ABLA: Hgb 12.5> EBL 100cc at delivery > 10.6, likely hemoconcentration on admit. Monitor bleeding Plan to continue to monitor, likely discharge Sunday  LOS: 1 day   Amber [name] 05/15/2019, 9:16 AM

## 2019-05-16 DIAGNOSIS — D62 Acute posthemorrhagic anemia: Secondary | ICD-10-CM | POA: Diagnosis not present

## 2019-05-16 LAB — CULTURE, BETA STREP (GROUP B ONLY)

## 2019-05-16 MED ORDER — MEDROXYPROGESTERONE ACETATE 150 MG/ML IM SUSP: 150.0000 mg | Freq: Once | INTRAMUSCULAR | Status: DC

## 2019-05-16 MED ORDER — IBUPROFEN 600 MG PO TABS: 600.0000 mg | ORAL_TABLET | Freq: Four times a day (QID) | ORAL | 1 refills | Status: DC

## 2019-05-16 MED ORDER — DOCUSATE SODIUM 50 MG PO CAPS: 50.0000 mg | ORAL_CAPSULE | Freq: Two times a day (BID) | ORAL | 0 refills | Status: DC

## 2019-05-16 MED ORDER — FERROUS SULFATE 325 (65 FE) MG PO TABS: 325.0000 mg | ORAL_TABLET | Freq: Every day | ORAL | 11 refills | Status: DC

## 2019-05-16 MED ORDER — ACETAMINOPHEN 325 MG PO TABS: 650.0000 mg | ORAL_TABLET | ORAL | 1 refills | Status: DC | PRN

## 2019-05-16 NOTE — Discharge Summary (Signed)
OB Discharge Summary     Patient Name: Amber [name] DOB: 20-Jul-2001 MRN: [id number]  Date of admission: 05/14/2019 Delivering MD: Lyda Kalata K   Date of discharge: 05/16/2019  Admitting diagnosis: 35wks water broke ctx 64mn Intrauterine pregnancy: 3108w1d   Secondary diagnosis:  Active Problems:   Preterm labor   Preeclampsia   Acute blood loss anemia  Additional problems: none     Discharge diagnosis: Preterm Pregnancy Delivered                                                                                                Post partum procedures:none  Augmentation: Pitocin  Complications: None  Hospital course:  Onset of Labor With Vaginal Delivery     [age].o. yo G1P0101 at 3562w1ds admitted in Active Labor on 05/14/2019. On admit she had newly elevated BP, diagnosed with preeclampsia by mild range Bps and UPC 1.2. Bps remained mild range on L&D and postpartum. For prematurity she was given BMZ.   Patient had an uncomplicated labor course as follows: on admit she was 2.5/70/-2, with rupture of membranes. She continued to progress, but then contractions spaced so pitocin was added for augmentation. She progressed normally and pushed for 60 seconds.  Membrane Rupture Time/Date: 10:00 AM ,05/14/2019   Intrapartum Procedures: Episiotomy: None [1]                                         Lacerations:  1st degree [2];Perineal [11]  Patient had a delivery of a Viable infant. 05/14/2019  Information for the patient's newborn:  GroMichaline, Kindig3[[id number]]elivery Method: Vaginal, Spontaneous(Filed from Delivery Summary)     Pateint had an uncomplicated postpartum course. BP were normotensive postpartum. Acute blood loss anemia, Hgb 12.5> EBL 100cc at delivery > 10.6, Bleeding appropriate postpartum and asymptomatic. Rubella nonimmune, received MMR vaccine postpartum.   She is ambulating, tolerating a regular diet, passing flatus, and urinating well. Patient  is discharged home in stable condition on 05/16/19.   Physical exam  Vitals:   05/15/19 1146 05/15/19 1416 05/15/19 2304 05/16/19 0615  BP: 114/60 124/67 108/67 106/60  Pulse: 99 (!) 102 92 82  Resp: _0 Temp: 97.8 F (36.6 C) 98.2 F (36.8 C) 98.2 F (36.8 C) 98.2 F (36.8 C)  TempSrc: Oral  Oral Oral  SpO2: 97%  100% 100%  Weight:      Height:      General: alert and no distress Lochia: appropriate Uterine Fundus: firm DVT Evaluation: No evidence of DVT seen on physical exam. Labs: Lab Results  Component Value Date   WBC 17.9 (H) 05/15/2019   HGB 10.6 (L) 05/15/2019   HCT 31.4 (L) 05/15/2019   MCV 85.8 05/15/2019   PLT 270 05/15/2019   CMP Latest Ref Rng & Units 05/14/2019  Glucose 70 - 99 mg/dL 73  BUN 6 - 20 mg/dL 7  Creatinine 0.44 - 1.00 mg/dL 0.58  Sodium 135 -  145 mmol/L 135  Potassium 3.5 - 5.1 mmol/L 4.2  Chloride 98 - 111 mmol/L 105  CO2 22 - 32 mmol/L 18(L)  Calcium 8.9 - 10.3 mg/dL 9.3  Total Protein 6.5 - 8.1 g/dL 6.6  Total Bilirubin 0.3 - 1.2 mg/dL 0.8  Alkaline Phos 38 - 126 U/L 288(H)  AST 15 - 41 U/L 17  ALT 0 - 44 U/L 17    Discharge instruction: per After Visit Summary and "Baby and Me Booklet".  After visit meds:  Allergies as of 05/16/2019   No Known Allergies     Medication List    STOP taking these medications   cyclobenzaprine 5 MG tablet Commonly known as: FLEXERIL   metroNIDAZOLE 500 MG tablet Commonly known as: Flagyl   prenatal vitamin w/FE, FA 27-1 MG Tabs tablet     TAKE these medications   acetaminophen 325 MG tablet Commonly known as: Tylenol Take 2 tablets (650 mg total) by mouth every 4 (four) hours as needed (for pain scale < 4).   docusate sodium 50 MG capsule Commonly known as: COLACE Take 1 capsule (50 mg total) by mouth 2 (two) times daily.   ferrous sulfate 325 (65 FE) MG tablet Commonly known as: FerrouSul Take 1 tablet (325 mg total) by mouth daily with breakfast.   ibuprofen 600 MG  tablet Commonly known as: ADVIL Take 1 tablet (600 mg total) by mouth every 6 (six) hours.       Diet: routine diet  Activity: Advance as tolerated. Pelvic rest for 6 weeks.   Outpatient follow up:4 weeks Follow up Appt:No future appointments. Follow up Visit:No follow-ups on file.  Postpartum contraception: depo at postpartum visit  Newborn Data: Live born female  Birth Weight: 6 lb 1.9 oz (2775 g) APGAR: 8, 9  Newborn Delivery   Birth date/time: 05/14/2019 20:02:00 Delivery type: Vaginal, Spontaneous      Baby Feeding: Bottle and Breast Disposition:home with mother   05/16/2019 Jonelle Sidle, MD

## 2019-05-16 NOTE — Lactation Note (Signed)
This note was copied from a baby's chart. Lactation Consultation Note  Patient Name: Amber [name] GNFAO'Z Date: 05/16/2019 Reason for consult: Follow-up assessment  P 1 - LPT ,  Baby is 42 hours old / post circ/ 5 % weight loss  As LC entered the room, baby was just coming back from the  tx nursery post circ., lightly sleeping.  LC 1st assessed moms breast tissue with permission and resized for  A #20 NS ( still a good fit ).  Mom has pumped off 7 ml of EBM and LC showed mom how to instill the  EBM into the top of the NS and then latch. Baby awake , and rooting , latched  And then baby took a few minutes to get organized to suck and latched but didn't suck. LC showed mom hoe to release baby from the breast and he burped.  LC fed the baby 6 ml of EBM with the bottle and then latched with depth and baby fed for 15 - 20 mins with swallows, breast softened down and cooler.  Milk is coming in and both breast are full, not engorged .  Baby was fed 15 ml of formula PACE feeding and mom plans to pump both breast for 15 -20 mins and save milk.  LC reviewed the [place] plan :  Breast shells between feedings except when sleeping until the nipple/ areola complex is more compressible to latch.  Prior to latch - breast massage , hand express, prepump if needed.  Apply the #20 NS and instill EBM in the top.  Latch with firm support - STS and feed for 15 -20 mins 30 mins max and  Supplement with a bottle 30 ml after feeding - PACE feeding.  Post pump for 15 -20 mins both breast and save for the next feeding.   [place] placed a request in the [facility] basket for them to call mom for appt by this Friday . Mom aware she will receive a call from the clinic.  Mom has a DEBP at home and is active with [facility].  Mom has the [facility] pamphlet with phone numbers for [place].     Maternal Data Has patient been taught Hand Expression?: Yes Does the patient have breastfeeding experience prior to this delivery?:  No  Feeding - breast milk and breast fed  Feeding Type: Formula Nipple Type: Slow - flow  LATCH Score Latch: Grasps breast easily, tongue down, lips flanged, rhythmical sucking.  Audible Swallowing: Spontaneous and intermittent  Type of Nipple: Everted at rest and after stimulation  Comfort (Breast/Nipple): Filling, red/small blisters or bruises, mild/mod discomfort  Hold (Positioning): Assistance needed to correctly position infant at breast and maintain latch.  LATCH Score: 8  Interventions Interventions: Breast feeding basics reviewed  Lactation Tools Discussed/Used Tools: Nipple Shields Nipple shield size: 20 Flange Size: 24 Shell Type: Inverted Breast pump type: Manual;Double-Electric Breast Pump WIC Program: Yes Pump Review: Milk Storage   Consult Status Consult Status: Follow-up(LC offered to request and LC O/P in Epic for [facility] LC O/P - mom receptive) Date: ([place] placed a request in Epic basket for the clinic to call mom for appt by Friday of this week) Follow-up type: [place] 05/16/2019, 10:01 AM

## 2019-05-16 NOTE — Progress Notes (Addendum)
Post Partum Day 2 Subjective: no complaints, up ad lib, voiding and tolerating PO  Objective: Vitals:   05/15/19 1146 05/15/19 1416 05/15/19 2304 05/16/19 0615  BP: 114/60 124/67 108/67 106/60  Pulse: 99 (!) 102 92 82  Resp: _0 Temp: 97.8 F (36.6 C) 98.2 F (36.8 C) 98.2 F (36.8 C) 98.2 F (36.8 C)  TempSrc: Oral  Oral Oral  SpO2: 97%  100% 100%  Weight:      Height:        Physical Exam:  General: alert and no distress Lochia: appropriate Uterine Fundus: firm DVT Evaluation: No evidence of DVT seen on physical exam.  Recent Labs    05/14/19 1201 05/15/19 0334  HGB 12.5 10.6*  HCT 36.9 31.4*    Assessment/Plan: Amber [name] 18 y.o. G1P0101 PPD#2 sp SVD at [redacted]w[redacted]d plan for DC today 1. PPC: 1st degree, doing well 2. Pree: Diagnosed on L&D, UPC 1.2, CBC/CMP wnl. Normotensive PP 3. ABLA: Hgb 12.5> EBL 100cc at delivery > 10.6, likely hemoconcentration on admit. Bleeding appropriate pp 4. Rubella nonimmune, will recieve MMR pp. Blood type A+. Breast + bottle. Desires Depo at postpartum visit   LOS: 2 days   Tommy Goostree K Taam-Akelman 05/16/2019, 8:35 AM

## 2019-05-17 LAB — SURGICAL PATHOLOGY

## 2019-06-02 DIAGNOSIS — R509 Fever, unspecified: Secondary | ICD-10-CM | POA: Diagnosis not present

## 2019-06-02 DIAGNOSIS — J029 Acute pharyngitis, unspecified: Secondary | ICD-10-CM | POA: Diagnosis not present

## 2019-06-02 DIAGNOSIS — R05 Cough: Secondary | ICD-10-CM | POA: Diagnosis not present

## 2019-06-03 DIAGNOSIS — J029 Acute pharyngitis, unspecified: Secondary | ICD-10-CM | POA: Diagnosis not present

## 2019-06-03 DIAGNOSIS — R509 Fever, unspecified: Secondary | ICD-10-CM | POA: Diagnosis not present

## 2019-06-03 DIAGNOSIS — R05 Cough: Secondary | ICD-10-CM | POA: Diagnosis not present

## 2019-06-17 DIAGNOSIS — Z1331 Encounter for screening for depression: Secondary | ICD-10-CM | POA: Diagnosis not present

## 2019-07-02 DIAGNOSIS — F53 Postpartum depression: Secondary | ICD-10-CM | POA: Insufficient documentation

## 2019-07-08 DIAGNOSIS — K011 Impacted teeth: Secondary | ICD-10-CM | POA: Diagnosis not present

## 2019-07-27 ENCOUNTER — Ambulatory Visit: Payer: Medicaid Other | Attending: Internal Medicine

## 2019-07-27 DIAGNOSIS — Z20822 Contact with and (suspected) exposure to covid-19: Secondary | ICD-10-CM

## 2019-07-27 DIAGNOSIS — Z20828 Contact with and (suspected) exposure to other viral communicable diseases: Secondary | ICD-10-CM | POA: Diagnosis not present

## 2019-07-28 LAB — NOVEL CORONAVIRUS, NAA: SARS-CoV-2, NAA: NOT DETECTED

## 2019-08-10 DIAGNOSIS — Z03818 Encounter for observation for suspected exposure to other biological agents ruled out: Secondary | ICD-10-CM | POA: Diagnosis not present

## 2019-08-10 DIAGNOSIS — J0110 Acute frontal sinusitis, unspecified: Secondary | ICD-10-CM | POA: Diagnosis not present

## 2019-08-10 DIAGNOSIS — J029 Acute pharyngitis, unspecified: Secondary | ICD-10-CM | POA: Diagnosis not present

## 2019-08-30 ENCOUNTER — Ambulatory Visit: Payer: Medicaid Other | Attending: Internal Medicine

## 2019-08-30 DIAGNOSIS — Z20822 Contact with and (suspected) exposure to covid-19: Secondary | ICD-10-CM

## 2019-08-31 LAB — NOVEL CORONAVIRUS, NAA: SARS-CoV-2, NAA: NOT DETECTED

## 2019-09-23 DIAGNOSIS — Z3040 Encounter for surveillance of contraceptives, unspecified: Secondary | ICD-10-CM | POA: Diagnosis not present

## 2019-09-27 DIAGNOSIS — F411 Generalized anxiety disorder: Secondary | ICD-10-CM | POA: Diagnosis not present

## 2019-09-27 DIAGNOSIS — F332 Major depressive disorder, recurrent severe without psychotic features: Secondary | ICD-10-CM | POA: Diagnosis not present

## 2019-10-14 DIAGNOSIS — R109 Unspecified abdominal pain: Secondary | ICD-10-CM | POA: Diagnosis not present

## 2019-12-07 DIAGNOSIS — F411 Generalized anxiety disorder: Secondary | ICD-10-CM | POA: Diagnosis not present

## 2019-12-16 DIAGNOSIS — Z3042 Encounter for surveillance of injectable contraceptive: Secondary | ICD-10-CM | POA: Diagnosis not present

## 2020-02-11 DIAGNOSIS — R519 Headache, unspecified: Secondary | ICD-10-CM | POA: Diagnosis not present

## 2020-02-11 DIAGNOSIS — Z20822 Contact with and (suspected) exposure to covid-19: Secondary | ICD-10-CM | POA: Diagnosis not present

## 2020-03-23 DIAGNOSIS — U071 COVID-19: Secondary | ICD-10-CM | POA: Diagnosis not present

## 2020-03-23 DIAGNOSIS — Z03818 Encounter for observation for suspected exposure to other biological agents ruled out: Secondary | ICD-10-CM | POA: Diagnosis not present

## 2020-03-23 DIAGNOSIS — H6692 Otitis media, unspecified, left ear: Secondary | ICD-10-CM | POA: Diagnosis not present

## 2020-03-31 DIAGNOSIS — Z3042 Encounter for surveillance of injectable contraceptive: Secondary | ICD-10-CM | POA: Diagnosis not present

## 2020-07-01 DIAGNOSIS — B349 Viral infection, unspecified: Secondary | ICD-10-CM | POA: Diagnosis not present

## 2020-07-01 DIAGNOSIS — Z79899 Other long term (current) drug therapy: Secondary | ICD-10-CM | POA: Diagnosis not present

## 2020-07-01 DIAGNOSIS — Z20822 Contact with and (suspected) exposure to covid-19: Secondary | ICD-10-CM | POA: Diagnosis not present

## 2020-07-01 DIAGNOSIS — R6883 Chills (without fever): Secondary | ICD-10-CM | POA: Diagnosis not present

## 2020-07-01 DIAGNOSIS — B338 Other specified viral diseases: Secondary | ICD-10-CM | POA: Diagnosis not present

## 2020-07-10 DIAGNOSIS — Z113 Encounter for screening for infections with a predominantly sexual mode of transmission: Secondary | ICD-10-CM | POA: Diagnosis not present

## 2020-07-10 DIAGNOSIS — Z0000 Encounter for general adult medical examination without abnormal findings: Secondary | ICD-10-CM | POA: Diagnosis not present

## 2020-07-19 DIAGNOSIS — J029 Acute pharyngitis, unspecified: Secondary | ICD-10-CM | POA: Diagnosis not present

## 2020-07-19 DIAGNOSIS — Z03818 Encounter for observation for suspected exposure to other biological agents ruled out: Secondary | ICD-10-CM | POA: Diagnosis not present

## 2020-08-26 ENCOUNTER — Emergency Department (HOSPITAL_COMMUNITY)
Admission: EM | Admit: 2020-08-26 | Discharge: 2020-08-27 | Disposition: A | Discharge status: Home / Self Care | Payer: 59 | Attending: Emergency Medicine | Admitting: Emergency Medicine

## 2020-08-26 ENCOUNTER — Other Ambulatory Visit: Payer: Self-pay

## 2020-08-26 DIAGNOSIS — R1030 Lower abdominal pain, unspecified: Secondary | ICD-10-CM | POA: Insufficient documentation

## 2020-08-26 DIAGNOSIS — R519 Headache, unspecified: Secondary | ICD-10-CM | POA: Insufficient documentation

## 2020-08-26 DIAGNOSIS — R531 Weakness: Secondary | ICD-10-CM | POA: Diagnosis not present

## 2020-08-26 DIAGNOSIS — Z5321 Procedure and treatment not carried out due to patient leaving prior to being seen by health care provider: Secondary | ICD-10-CM | POA: Diagnosis not present

## 2020-08-26 LAB — COMPREHENSIVE METABOLIC PANEL WITH GFR
ALT: 15 U/L (ref 0–44)
AST: 16 U/L (ref 15–41)
Albumin: 4 g/dL (ref 3.5–5.0)
Alkaline Phosphatase: 82 U/L (ref 38–126)
Anion gap: 9 (ref 5–15)
BUN: 10 mg/dL (ref 6–20)
CO2: 23 mmol/L (ref 22–32)
Calcium: 9.2 mg/dL (ref 8.9–10.3)
Chloride: 106 mmol/L (ref 98–111)
Creatinine, Ser: 0.75 mg/dL (ref 0.44–1.00)
GFR, Estimated: >60 mL/min
Glucose, Bld: 101 mg/dL — ABNORMAL HIGH (ref 70–99)
Potassium: 4.4 mmol/L (ref 3.5–5.1)
Sodium: 138 mmol/L (ref 135–145)
Total Bilirubin: 0.5 mg/dL (ref 0.3–1.2)
Total Protein: 6.8 g/dL (ref 6.5–8.1)

## 2020-08-26 LAB — CBC
HCT: 41 % (ref 36.0–46.0)
Hemoglobin: 13.2 g/dL (ref 12.0–15.0)
MCH: 27.4 pg (ref 26.0–34.0)
MCHC: 32.2 g/dL (ref 30.0–36.0)
MCV: 85.1 fL (ref 80.0–100.0)
Platelets: 182 K/uL (ref 150–400)
RBC: 4.82 MIL/uL (ref 3.87–5.11)
RDW: 13.5 % (ref 11.5–15.5)
WBC: 8 K/uL (ref 4.0–10.5)
nRBC: 0 % (ref 0.0–0.2)

## 2020-08-26 LAB — LIPASE, BLOOD: Lipase: 26 U/L (ref 11–51)

## 2020-08-26 LAB — URINALYSIS, ROUTINE W REFLEX MICROSCOPIC
Bilirubin Urine: NEGATIVE
Glucose, UA: NEGATIVE mg/dL
Hgb urine dipstick: NEGATIVE
Ketones, ur: NEGATIVE mg/dL
Nitrite: NEGATIVE
Protein, ur: NEGATIVE mg/dL
Specific Gravity, Urine: 1.005 (ref 1.005–1.030)
pH: 7 (ref 5.0–8.0)

## 2020-08-26 LAB — I-STAT BETA HCG BLOOD, ED (MC, WL, AP ONLY): I-stat hCG, quantitative: <5 m[IU]/mL (ref <5)

## 2020-08-26 NOTE — ED Triage Notes (Signed)
Pt presents to ED POv. Pt c/o lower abd pain. Pt also c/o weakness and light headed when standing or lying. Pt denies and GI/GU complaints.

## 2020-08-27 NOTE — ED Notes (Addendum)
Pt called for vitals X1 X2 X3. Removed from waiting room list

## 2020-08-28 ENCOUNTER — Ambulatory Visit: Payer: Self-pay

## 2020-10-24 DIAGNOSIS — R110 Nausea: Secondary | ICD-10-CM | POA: Diagnosis not present

## 2020-10-24 DIAGNOSIS — R109 Unspecified abdominal pain: Secondary | ICD-10-CM | POA: Diagnosis not present

## 2020-10-25 ENCOUNTER — Emergency Department (HOSPITAL_COMMUNITY)
Admission: EM | Admit: 2020-10-25 | Discharge: 2020-10-26 | Disposition: A | Discharge status: Home / Self Care | Payer: 59 | Attending: Emergency Medicine | Admitting: Emergency Medicine

## 2020-10-25 ENCOUNTER — Other Ambulatory Visit: Payer: Self-pay

## 2020-10-25 ENCOUNTER — Encounter (HOSPITAL_COMMUNITY): Payer: Self-pay

## 2020-10-25 DIAGNOSIS — R109 Unspecified abdominal pain: Secondary | ICD-10-CM | POA: Diagnosis not present

## 2020-10-25 DIAGNOSIS — R197 Diarrhea, unspecified: Secondary | ICD-10-CM | POA: Diagnosis not present

## 2020-10-25 DIAGNOSIS — R110 Nausea: Secondary | ICD-10-CM | POA: Diagnosis not present

## 2020-10-25 DIAGNOSIS — K529 Noninfective gastroenteritis and colitis, unspecified: Secondary | ICD-10-CM

## 2020-10-25 DIAGNOSIS — J45909 Unspecified asthma, uncomplicated: Secondary | ICD-10-CM | POA: Diagnosis not present

## 2020-10-25 LAB — COMPREHENSIVE METABOLIC PANEL
ALT: 103 U/L — ABNORMAL HIGH (ref 0–44)
AST: 58 U/L — ABNORMAL HIGH (ref 15–41)
Albumin: 4 g/dL (ref 3.5–5.0)
Alkaline Phosphatase: 78 U/L (ref 38–126)
Anion gap: 8 (ref 5–15)
BUN: 9 mg/dL (ref 6–20)
CO2: 19 mmol/L — ABNORMAL LOW (ref 22–32)
Calcium: 8.9 mg/dL (ref 8.9–10.3)
Chloride: 107 mmol/L (ref 98–111)
Creatinine, Ser: 0.66 mg/dL (ref 0.44–1.00)
GFR, Estimated: >60 mL/min (ref >60)
Glucose, Bld: 105 mg/dL — ABNORMAL HIGH (ref 70–99)
Potassium: 3.8 mmol/L (ref 3.5–5.1)
Sodium: 134 mmol/L — ABNORMAL LOW (ref 135–145)
Total Bilirubin: 0.4 mg/dL (ref 0.3–1.2)
Total Protein: 6.6 g/dL (ref 6.5–8.1)

## 2020-10-25 LAB — URINALYSIS, ROUTINE W REFLEX MICROSCOPIC
Bilirubin Urine: NEGATIVE
Glucose, UA: NEGATIVE mg/dL
Hgb urine dipstick: NEGATIVE
Ketones, ur: NEGATIVE mg/dL
Nitrite: NEGATIVE
Protein, ur: NEGATIVE mg/dL
Specific Gravity, Urine: 1.018 (ref 1.005–1.030)
pH: 5 (ref 5.0–8.0)

## 2020-10-25 LAB — CBC
HCT: 41.7 % (ref 36.0–46.0)
Hemoglobin: 14.2 g/dL (ref 12.0–15.0)
MCH: 28.9 pg (ref 26.0–34.0)
MCHC: 34.1 g/dL (ref 30.0–36.0)
MCV: 84.8 fL (ref 80.0–100.0)
Platelets: 154 10*3/uL (ref 150–400)
RBC: 4.92 MIL/uL (ref 3.87–5.11)
RDW: 13.5 % (ref 11.5–15.5)
WBC: 7.2 10*3/uL (ref 4.0–10.5)
nRBC: 0 % (ref 0.0–0.2)

## 2020-10-25 LAB — I-STAT BETA HCG BLOOD, ED (MC, WL, AP ONLY): I-stat hCG, quantitative: <5 m[IU]/mL (ref <5)

## 2020-10-25 LAB — LIPASE, BLOOD: Lipase: 28 U/L (ref 11–51)

## 2020-10-25 MED ORDER — ALUM & MAG HYDROXIDE-SIMETH 200-200-20 MG/5ML PO SUSP
30.0000 mL | Freq: Once | ORAL | Status: AC
  Administered 2020-10-25: 30 mL via ORAL
  Filled 2020-10-25: qty 30

## 2020-10-25 MED ORDER — LIDOCAINE VISCOUS HCL 2 % MT SOLN
15.0000 mL | Freq: Once | OROMUCOSAL | Status: AC
  Administered 2020-10-25: 15 mL via ORAL
  Filled 2020-10-25: qty 15

## 2020-10-25 MED ORDER — ONDANSETRON 4 MG PO TBDP
8.0000 mg | ORAL_TABLET | Freq: Once | ORAL | Status: AC
  Administered 2020-10-25: 8 mg via ORAL
  Filled 2020-10-25: qty 2

## 2020-10-25 NOTE — ED Triage Notes (Signed)
Patient complains of 1 week of abdominal/stomach burning with diarrhea. Some nausea with same. Patient states that the burning is worse with any intake

## 2020-10-25 NOTE — ED Provider Notes (Signed)
[facility] EMERGENCY DEPARTMENT Provider Note   CSN: [id number] Arrival date & time: 10/25/20  1733     History No chief complaint on file.   Amber [name] is a 20 y.o. female.  Patient is a 20 year old female with history of asthma.  She presents today for evaluation of loose stools and abdominal cramping for the past week.  She describes burning in her stomach that is worse at night when she sleeps and when she eats.  She denies any bloody stool.  She has felt nauseated, but has not vomited.  She denies fevers or chills.  She denies ill contacts or having consumed any suspicious foods.  The history is provided by the patient.       Past Medical History:  Diagnosis Date  . Asthma   . PONV (postoperative nausea and vomiting)   . Skull fracture (HCC)    at 77 months old    Patient Active Problem List   Diagnosis Date Noted  . Acute blood loss anemia 05/16/2019  . Preterm labor 05/15/2019  . Preeclampsia 05/15/2019  . Pediatric pre-birth visit for expectant parent 04/30/2019    Past Surgical History:  Procedure Laterality Date  . INNER EAR SURGERY Left      OB History    Gravida  1   Para  1   Term      Preterm  1   AB      Living  1     SAB      IAB      Ectopic      Multiple  0   Live Births  1           Family History  Problem Relation Age of Onset  . Diabetes Mother     Social History   Tobacco Use  . Smoking status: Never Smoker  . Smokeless tobacco: Never Used  Vaping Use  . Vaping Use: Never used  Substance Use Topics  . Alcohol use: Never  . Drug use: Never    Home Medications Prior to Admission medications   Medication Sig Start Date End Date Taking? Authorizing Provider  acetaminophen (TYLENOL) 325 MG tablet Take 2 tablets (650 mg total) by mouth every 4 (four) hours as needed (for pain scale < 4). 05/16/19   Taam-Akelman, Griselda Miner, MD  docusate sodium (COLACE) 50 MG capsule Take 1 capsule (50  mg total) by mouth 2 (two) times daily. 05/16/19   Taam-Akelman, Griselda Miner, MD  ferrous sulfate (FERROUSUL) 325 (65 FE) MG tablet Take 1 tablet (325 mg total) by mouth daily with breakfast. 05/16/19   Taam-Akelman, Griselda Miner, MD  ibuprofen (ADVIL) 600 MG tablet Take 1 tablet (600 mg total) by mouth every 6 (six) hours. 05/16/19   Rande Brunt, MD    Allergies    Patient has no known allergies.  Review of Systems   Review of Systems  All other systems reviewed and are negative.   Physical Exam Updated Vital Signs BP 105/73 (BP Location: Right Arm)   Pulse 74   Temp 98.6 F (37 C) (Oral)   Resp 16   SpO2 96%   Physical Exam Vitals and nursing note reviewed.  Constitutional:      General: She is not in acute distress.    Appearance: She is well-developed. She is not diaphoretic.  HENT:     Head: Normocephalic and atraumatic.  Cardiovascular:     Rate and Rhythm: Normal rate and  regular rhythm.     Heart sounds: No murmur heard. No friction rub. No gallop.   Pulmonary:     Effort: Pulmonary effort is normal. No respiratory distress.     Breath sounds: Normal breath sounds. No wheezing.  Abdominal:     General: Bowel sounds are normal. There is no distension.     Palpations: Abdomen is soft.     Tenderness: There is no abdominal tenderness.  Musculoskeletal:        General: Normal range of motion.     Cervical back: Normal range of motion and neck supple.  Skin:    General: Skin is warm and dry.  Neurological:     Mental Status: She is alert and oriented to person, place, and time.     ED Results / Procedures / Treatments   Labs (all labs ordered are listed, but only abnormal results are displayed) Labs Reviewed  COMPREHENSIVE METABOLIC PANEL - Abnormal; Notable for the following components:      Result Value   Sodium 134 (*)    CO2 19 (*)    Glucose, Bld 105 (*)    AST 58 (*)    ALT 103 (*)    All other components within normal limits  URINALYSIS,  ROUTINE W REFLEX MICROSCOPIC - Abnormal; Notable for the following components:   APPearance HAZY (*)    Leukocytes,Ua SMALL (*)    Bacteria, UA RARE (*)    All other components within normal limits  LIPASE, BLOOD  CBC  I-STAT BETA HCG BLOOD, ED ([facility], [facility], [facility] ONLY)    EKG None  Radiology No results found.  Procedures Procedures   Medications Ordered in ED Medications  ondansetron (ZOFRAN-ODT) disintegrating tablet 8 mg (has no administration in time range)  alum & mag hydroxide-simeth (MAALOX/MYLANTA) 200-200-20 MG/5ML suspension 30 mL (has no administration in time range)    And  lidocaine (XYLOCAINE) 2 % viscous mouth solution 15 mL (has no administration in time range)    ED Course  I have reviewed the triage vital signs and the nursing notes.  Pertinent labs & imaging results that were available during my care of the patient were reviewed by me and considered in my medical decision making (see chart for details).    MDM Rules/Calculators/A&P  Patient presenting here with complaints of abdominal burning, cramping, and diarrhea.  This is been ongoing for the past several days.  Patient has no fever, vitals are stable, and there is no leukocytosis.  She appears well-hydrated.  I highly suspect a viral etiology.  Patient does have mild elevations of her LFTs and an acute hepatitis panel is pending.  Patient has no abdominal tenderness.  Abdomen is soft and benign and I do not feel as though any imaging studies are indicated.  Patient will be discharged with Prilosec and Zofran.  She is to return as needed if symptoms do not improve or worsen.  Final Clinical Impression(s) / ED Diagnoses Final diagnoses:  None    Rx / DC Orders ED Discharge Orders    None       Geoffery Lyons, MD 10/26/20 [phone]

## 2020-10-26 LAB — HEPATITIS PANEL, ACUTE
HCV Ab: NONREACTIVE
Hep A IgM: NONREACTIVE
Hep B C IgM: NONREACTIVE
Hepatitis B Surface Ag: NONREACTIVE

## 2020-10-26 MED ORDER — ONDANSETRON HCL 8 MG PO TABS: 8.0000 mg | ORAL_TABLET | ORAL | 0 refills | Status: DC | PRN

## 2020-10-26 MED ORDER — OMEPRAZOLE 20 MG PO CPDR: 20.0000 mg | DELAYED_RELEASE_CAPSULE | Freq: Every day | ORAL | 0 refills | Status: DC

## 2020-10-26 NOTE — ED Notes (Signed)
Gave pt ginger ale.  

## 2020-10-26 NOTE — Discharge Instructions (Signed)
Begin taking Prilosec as prescribed.  Begin taking Zofran as prescribed as needed for nausea.  Return to the emergency department if you develop severe abdominal pain, bloody stools, or other new and concerning symptoms.

## 2020-10-27 DIAGNOSIS — R110 Nausea: Secondary | ICD-10-CM | POA: Diagnosis not present

## 2020-10-30 DIAGNOSIS — R197 Diarrhea, unspecified: Secondary | ICD-10-CM | POA: Diagnosis not present

## 2020-11-28 DIAGNOSIS — N939 Abnormal uterine and vaginal bleeding, unspecified: Secondary | ICD-10-CM | POA: Diagnosis not present

## 2020-11-28 DIAGNOSIS — Z3202 Encounter for pregnancy test, result negative: Secondary | ICD-10-CM | POA: Diagnosis not present

## 2020-12-01 ENCOUNTER — Other Ambulatory Visit: Payer: Self-pay

## 2020-12-01 ENCOUNTER — Emergency Department (HOSPITAL_COMMUNITY)
Admission: EM | Admit: 2020-12-01 | Discharge: 2020-12-02 | Disposition: A | Discharge status: Home / Self Care | Payer: 59 | Attending: Emergency Medicine | Admitting: Emergency Medicine

## 2020-12-01 ENCOUNTER — Encounter (HOSPITAL_COMMUNITY): Payer: Self-pay | Admitting: Emergency Medicine

## 2020-12-01 DIAGNOSIS — R000 Tachycardia, unspecified: Secondary | ICD-10-CM | POA: Diagnosis not present

## 2020-12-01 DIAGNOSIS — T7800XA Anaphylactic reaction due to unspecified food, initial encounter: Secondary | ICD-10-CM | POA: Diagnosis not present

## 2020-12-01 DIAGNOSIS — J45909 Unspecified asthma, uncomplicated: Secondary | ICD-10-CM | POA: Insufficient documentation

## 2020-12-01 DIAGNOSIS — R55 Syncope and collapse: Secondary | ICD-10-CM | POA: Diagnosis present

## 2020-12-01 DIAGNOSIS — T782XXA Anaphylactic shock, unspecified, initial encounter: Secondary | ICD-10-CM

## 2020-12-01 DIAGNOSIS — R0602 Shortness of breath: Secondary | ICD-10-CM | POA: Insufficient documentation

## 2020-12-01 DIAGNOSIS — D72829 Elevated white blood cell count, unspecified: Secondary | ICD-10-CM | POA: Diagnosis not present

## 2020-12-01 MED ORDER — FAMOTIDINE IN NACL 20-0.9 MG/50ML-% IV SOLN
20.0000 mg | Freq: Once | INTRAVENOUS | Status: AC
Start: 2020-12-01 — End: 2020-12-02
  Administered 2020-12-01: 20 mg via INTRAVENOUS
  Filled 2020-12-01: qty 50

## 2020-12-01 MED ORDER — METHYLPREDNISOLONE SODIUM SUCC 125 MG IJ SOLR
125.0000 mg | Freq: Once | INTRAMUSCULAR | Status: AC
  Administered 2020-12-01: 125 mg via INTRAVENOUS
  Filled 2020-12-01: qty 2

## 2020-12-01 MED ORDER — ONDANSETRON HCL 4 MG/2ML IJ SOLN
4.0000 mg | Freq: Once | INTRAMUSCULAR | Status: AC
  Administered 2020-12-01: 4 mg via INTRAVENOUS
  Filled 2020-12-01: qty 2

## 2020-12-01 NOTE — ED Provider Notes (Signed)
[facility] EMERGENCY DEPARTMENT Provider Note   CSN: [id number] Arrival date & time: 12/01/20  2245     History Chief Complaint  Patient presents with  . Allergic Reaction    Amber [name] is a 20 y.o. female with a history of childhood asthma who presents the emergency department by EMS with a chief complaint of syncope.  The patient reports that she had a Hawaiian pizza tonight for dinner around 20:00.  Approximately 5 minutes after she finished eating, the patient suddenly developed nausea, vomiting, diarrhea accompanied by shortness of breath, throat closing, tongue swelling, itching, and hives.  The patient went to a dollar store to use the restroom and suspects that she had a syncopal episode as she does not recall what happened after she went to the bathroom.  When she went, she called her mother and advised her to call 911 for her.  She denies hitting her head.  EMS reports that on arrival that the patient was noted to be hypotensive with systolic blood pressure in the 80s.  She was given 0.3 mg of IM epinephrine, 50 mg of IV Benadryl, and 600 cc of normal saline.  Patient reports that she feels significantly improved and all of her symptoms have resolved.  She denies fever, chills, hematochezia, melena, headache, back pain, chest pain, weakness, numbness.  She denies any known allergies to food products.  No new foods tonight.  No new soaps, lotions, hygiene products.  No history of allergic reaction or anaphylaxis.  No other treatment prior to arrival.   The history is provided by the patient and medical records. No language interpreter was used.       Past Medical History:  Diagnosis Date  . Asthma   . PONV (postoperative nausea and vomiting)   . Skull fracture (HCC)    at 45 months old    Patient Active Problem List   Diagnosis Date Noted  . Acute blood loss anemia 05/16/2019  . Preterm labor 05/15/2019  . Preeclampsia 05/15/2019  .  Pediatric pre-birth visit for expectant parent 04/30/2019    Past Surgical History:  Procedure Laterality Date  . INNER EAR SURGERY Left      OB History    Gravida  1   Para  1   Term      Preterm  1   AB      Living  1     SAB      IAB      Ectopic      Multiple  0   Live Births  1           Family History  Problem Relation Age of Onset  . Diabetes Mother     Social History   Tobacco Use  . Smoking status: Never Smoker  . Smokeless tobacco: Never Used  Vaping Use  . Vaping Use: Never used  Substance Use Topics  . Alcohol use: Never  . Drug use: Never    Home Medications Prior to Admission medications   Medication Sig Start Date End Date Taking? Authorizing Provider  acetaminophen (TYLENOL) 325 MG tablet Take 2 tablets (650 mg total) by mouth every 4 (four) hours as needed (for pain scale < 4). Patient not taking: Reported on 10/26/2020 05/16/19   Rande Brunt, MD  docusate sodium (COLACE) 50 MG capsule Take 1 capsule (50 mg total) by mouth 2 (two) times daily. Patient not taking: No sig reported 05/16/19   Janey Greaser  K, MD  ferrous sulfate (FERROUSUL) 325 (65 FE) MG tablet Take 1 tablet (325 mg total) by mouth daily with breakfast. Patient not taking: No sig reported 05/16/19   Rande Brunt, MD  ibuprofen (ADVIL) 600 MG tablet Take 1 tablet (600 mg total) by mouth every 6 (six) hours. Patient not taking: No sig reported 05/16/19   Rande Brunt, MD  omeprazole (PRILOSEC) 20 MG capsule Take 1 capsule (20 mg total) by mouth daily. 10/26/20   Geoffery Lyons, MD  ondansetron (ZOFRAN) 8 MG tablet Take 1 tablet (8 mg total) by mouth every 4 (four) hours as needed for nausea. 10/26/20   Geoffery Lyons, MD    Allergies    Patient has no known allergies.  Review of Systems   Review of Systems  Constitutional: Negative for activity change, chills and fever.  HENT: Positive for facial swelling and trouble  swallowing. Negative for congestion, sinus pressure, sinus pain and sore throat.   Eyes: Negative for visual disturbance.  Respiratory: Positive for shortness of breath. Negative for cough, choking and wheezing.   Cardiovascular: Negative for chest pain, palpitations and leg swelling.  Gastrointestinal: Positive for abdominal pain, diarrhea, nausea and vomiting.  Genitourinary: Negative for dysuria.  Musculoskeletal: Negative for back pain, neck pain and neck stiffness.  Skin: Positive for rash.  Allergic/Immunologic: Negative for immunocompromised state.  Neurological: Positive for syncope. Negative for dizziness, speech difficulty, weakness, numbness and headaches.  Psychiatric/Behavioral: Negative for confusion.    Physical Exam Updated Vital Signs BP 115/65 (BP Location: Right Arm)   Pulse (!) 101   Temp 98.3 F (36.8 C) (Oral)   Resp (!) 21   SpO2 100%   Physical Exam Vitals and nursing note reviewed.  Constitutional:      General: She is not in acute distress.    Appearance: She is not ill-appearing, toxic-appearing or diaphoretic.  HENT:     Head: Normocephalic.     Mouth/Throat:     Mouth: Mucous membranes are moist.     Comments: Posterior oropharynx is patent.  No edema noted to the lips or tongue.  Tolerating secretions without difficulty.  Phonation is normal. Eyes:     General: No scleral icterus.    Conjunctiva/sclera: Conjunctivae normal.  Cardiovascular:     Rate and Rhythm: Normal rate and regular rhythm.     Pulses: Normal pulses.     Heart sounds: Normal heart sounds. No murmur heard. No friction rub. No gallop.   Pulmonary:     Effort: Pulmonary effort is normal. No respiratory distress.     Breath sounds: No stridor. No wheezing, rhonchi or rales.     Comments: Lungs are clear to auscultation bilaterally. Chest:     Chest wall: No tenderness.  Abdominal:     General: There is no distension.     Palpations: Abdomen is soft. There is no mass.      Tenderness: There is no abdominal tenderness. There is no right CVA tenderness, left CVA tenderness, guarding or rebound.     Hernia: No hernia is present.     Comments: Abdomen is soft, nontender, nondistended.  Musculoskeletal:     Cervical back: Neck supple.     Right lower leg: No edema.     Left lower leg: No edema.  Skin:    General: Skin is warm.     Coloration: Skin is pale.     Findings: No rash.  Neurological:     Mental Status: She is alert.  Psychiatric:        Behavior: Behavior normal.     ED Results / Procedures / Treatments   Labs (all labs ordered are listed, but only abnormal results are displayed) Labs Reviewed  I-STAT BETA HCG BLOOD, ED ([facility], [facility], [facility] ONLY)    EKG None  Radiology No results found.  Procedures Procedures   Medications Ordered in ED Medications  methylPREDNISolone sodium succinate (SOLU-MEDROL) 125 mg/2 mL injection 125 mg (has no administration in time range)  famotidine (PEPCID) IVPB 20 mg premix (has no administration in time range)  ondansetron (ZOFRAN) injection 4 mg (has no administration in time range)    ED Course  I have reviewed the triage vital signs and the nursing notes.  Pertinent labs & imaging results that were available during my care of the patient were reviewed by me and considered in my medical decision making (see chart for details).    MDM Rules/Calculators/A&P                          20 year old female with history of childhood asthma presenting by EMS for sudden onset of syncope, nausea, vomiting, diarrhea, shortness of breath, throat closing, and tongue swelling that began shortly after eating a Hawaiian pizza.  She was given IM epinephrine, Benadryl, and 600 cc of normal saline in route.  In the ED, all of her symptoms have resolved and she is feeling much better.  Minimally tachycardic on arrival.  She is normotensive with a blood pressure of 115/65.  No tachypnea.  She is not hypoxic.  We will give IV  Pepcid and Solu-Medrol and monitor the patient that she was given epinephrine as well as check basic labs.   Labs have been reviewed and independently interpreted by me.  She has a leukocytosis of 17, likely secondary to allergic reaction.  Glucose is also elevated, likely secondary to corticosteroid use.  Labs are otherwise stable from previous.  Patient has been examined multiple times and remained stable.  She remains symptom-free at this time.  Her abdomen is benign.  Patient was observed for 4 hours following epinephrine administration and remained clinically stable.  Given her symptoms, I also consider the following on her differential diagnosis including cholecystitis, appendicitis, colitis, COVID-19, or other intra-abdominal infection.  Strongly suspect anaphylaxis.  Patient was counseled on EpiPen use.  She will be discharged with a corticosteroid taper.  She has been given a referral to allergy and immunology and avoidance of food products in her meal tonight until she has been seen by allergy and immunology.  At this time, she is hemodynamically stable and in no acute distress.  Safe for discharge to home with outpatient follow-up as indicated.  Final Clinical Impression(s) / ED Diagnoses Final diagnoses:  None    Rx / DC Orders ED Discharge Orders    None       Gavin Faivre A, PA-C 12/02/20 0302    Gilda Crease, MD 12/02/20 [phone]

## 2020-12-01 NOTE — ED Triage Notes (Signed)
Pt BIB [facility], c/o hives, itching nausea/vomiting, diarrhea, and a syncopal episode after eating Hawaiian pizza tonight. Pt noted to be hypotensive, SBP 80s after syncopal episode. Pt given 0.3mg IM epi, 50mg  benadryl IV, and 600ccNS pta. Pt reports improvement.

## 2020-12-02 LAB — CBC
HCT: 35.4 % — ABNORMAL LOW (ref 36.0–46.0)
Hemoglobin: 11.8 g/dL — ABNORMAL LOW (ref 12.0–15.0)
MCH: 28.7 pg (ref 26.0–34.0)
MCHC: 33.3 g/dL (ref 30.0–36.0)
MCV: 86.1 fL (ref 80.0–100.0)
Platelets: 159 10*3/uL (ref 150–400)
RBC: 4.11 MIL/uL (ref 3.87–5.11)
RDW: 12.9 % (ref 11.5–15.5)
WBC: 17.1 10*3/uL — ABNORMAL HIGH (ref 4.0–10.5)
nRBC: 0 % (ref 0.0–0.2)

## 2020-12-02 LAB — COMPREHENSIVE METABOLIC PANEL
ALT: 12 U/L (ref 0–44)
AST: 16 U/L (ref 15–41)
Albumin: 3.3 g/dL — ABNORMAL LOW (ref 3.5–5.0)
Alkaline Phosphatase: 56 U/L (ref 38–126)
Anion gap: 5 (ref 5–15)
BUN: 9 mg/dL (ref 6–20)
CO2: 19 mmol/L — ABNORMAL LOW (ref 22–32)
Calcium: 8.3 mg/dL — ABNORMAL LOW (ref 8.9–10.3)
Chloride: 110 mmol/L (ref 98–111)
Creatinine, Ser: 0.87 mg/dL (ref 0.44–1.00)
GFR, Estimated: >60 mL/min (ref >60)
Glucose, Bld: 229 mg/dL — ABNORMAL HIGH (ref 70–99)
Potassium: 3.2 mmol/L — ABNORMAL LOW (ref 3.5–5.1)
Sodium: 134 mmol/L — ABNORMAL LOW (ref 135–145)
Total Bilirubin: 0.5 mg/dL (ref 0.3–1.2)
Total Protein: 5.1 g/dL — ABNORMAL LOW (ref 6.5–8.1)

## 2020-12-02 MED ORDER — PREDNISONE 10 MG PO TABS: ORAL_TABLET | ORAL | 0 refills | Status: AC

## 2020-12-02 MED ORDER — EPINEPHRINE 0.3 MG/0.3ML IJ SOAJ: 0.3000 mg | INTRAMUSCULAR | 0 refills | Status: AC | PRN

## 2020-12-02 NOTE — Discharge Instructions (Signed)
Thank you for allowing me to care for you today in the Emergency Department.   You were seen today for anaphylaxis.  This is most likely due to one of the ingredients in the pizza that you ate tonight.  You should avoid all of these ingredients until you are able to follow-up with the allergy and immunology team.  I have included their information above for the referral.  Please call their office to schedule follow-up appointment.  Starting tomorrow, take prednisone as prescribed.  If you are to start having itching or rash, you can take Benadryl or Pepcid.  These medications are both available over-the-counter.  Return to the emergency department if you develop respiratory distress, feeling as if your throat is closing, if your tongue or your lips start swelling, if you develop uncontrollable vomiting, abdominal pain, or other new, concerning symptoms.

## 2020-12-03 ENCOUNTER — Other Ambulatory Visit: Payer: Self-pay

## 2020-12-03 ENCOUNTER — Encounter (HOSPITAL_COMMUNITY): Payer: Self-pay

## 2020-12-03 ENCOUNTER — Emergency Department (HOSPITAL_COMMUNITY): Payer: 59

## 2020-12-03 ENCOUNTER — Emergency Department (HOSPITAL_COMMUNITY)
Admission: EM | Admit: 2020-12-03 | Discharge: 2020-12-03 | Disposition: A | Discharge status: Home / Self Care | Payer: 59 | Attending: Emergency Medicine | Admitting: Emergency Medicine

## 2020-12-03 DIAGNOSIS — D649 Anemia, unspecified: Secondary | ICD-10-CM | POA: Diagnosis not present

## 2020-12-03 DIAGNOSIS — R112 Nausea with vomiting, unspecified: Secondary | ICD-10-CM | POA: Insufficient documentation

## 2020-12-03 DIAGNOSIS — R079 Chest pain, unspecified: Secondary | ICD-10-CM | POA: Diagnosis not present

## 2020-12-03 DIAGNOSIS — R0602 Shortness of breath: Secondary | ICD-10-CM | POA: Insufficient documentation

## 2020-12-03 DIAGNOSIS — J45909 Unspecified asthma, uncomplicated: Secondary | ICD-10-CM | POA: Diagnosis not present

## 2020-12-03 DIAGNOSIS — R0789 Other chest pain: Secondary | ICD-10-CM | POA: Diagnosis not present

## 2020-12-03 DIAGNOSIS — N939 Abnormal uterine and vaginal bleeding, unspecified: Secondary | ICD-10-CM | POA: Insufficient documentation

## 2020-12-03 DIAGNOSIS — R0781 Pleurodynia: Secondary | ICD-10-CM | POA: Insufficient documentation

## 2020-12-03 DIAGNOSIS — R197 Diarrhea, unspecified: Secondary | ICD-10-CM | POA: Diagnosis not present

## 2020-12-03 LAB — I-STAT BETA HCG BLOOD, ED (MC, WL, AP ONLY): I-stat hCG, quantitative: <5 m[IU]/mL (ref <5)

## 2020-12-03 LAB — BASIC METABOLIC PANEL
Anion gap: 9 (ref 5–15)
BUN: 11 mg/dL (ref 6–20)
CO2: 20 mmol/L — ABNORMAL LOW (ref 22–32)
Calcium: 8.7 mg/dL — ABNORMAL LOW (ref 8.9–10.3)
Chloride: 112 mmol/L — ABNORMAL HIGH (ref 98–111)
Creatinine, Ser: 0.66 mg/dL (ref 0.44–1.00)
GFR, Estimated: >60 mL/min (ref >60)
Glucose, Bld: 89 mg/dL (ref 70–99)
Potassium: 3.3 mmol/L — ABNORMAL LOW (ref 3.5–5.1)
Sodium: 141 mmol/L (ref 135–145)

## 2020-12-03 LAB — CBC
HCT: 32.2 % — ABNORMAL LOW (ref 36.0–46.0)
Hemoglobin: 10.6 g/dL — ABNORMAL LOW (ref 12.0–15.0)
MCH: 28.6 pg (ref 26.0–34.0)
MCHC: 32.9 g/dL (ref 30.0–36.0)
MCV: 86.8 fL (ref 80.0–100.0)
Platelets: 209 10*3/uL (ref 150–400)
RBC: 3.71 MIL/uL — ABNORMAL LOW (ref 3.87–5.11)
RDW: 13.2 % (ref 11.5–15.5)
WBC: 10.4 10*3/uL (ref 4.0–10.5)
nRBC: 0 % (ref 0.0–0.2)

## 2020-12-03 LAB — D-DIMER, QUANTITATIVE: D-Dimer, Quant: 0.61 ug/mL-FEU — ABNORMAL HIGH (ref 0.00–0.50)

## 2020-12-03 LAB — TROPONIN I (HIGH SENSITIVITY)
Troponin I (High Sensitivity): 2 ng/L (ref <18)
Troponin I (High Sensitivity): 2 ng/L (ref <18)

## 2020-12-03 MED ORDER — KETOROLAC TROMETHAMINE 15 MG/ML IJ SOLN
15.0000 mg | Freq: Once | INTRAMUSCULAR | Status: AC
  Administered 2020-12-03: 15 mg via INTRAVENOUS
  Filled 2020-12-03: qty 1

## 2020-12-03 MED ORDER — IOHEXOL 350 MG/ML SOLN
50.0000 mL | Freq: Once | INTRAVENOUS | Status: AC | PRN
  Administered 2020-12-03: 50 mL via INTRAVENOUS

## 2020-12-03 NOTE — ED Provider Notes (Signed)
5:28 PM Care of the patient assumed at signout.  Now, informing her of her CT angiography results she is accompanied by another female.  Patient CT results reassuring, no evidence for pulmonary embolism, pneumonia.  Patient notes a history of asthma as a child.  She states that she is comfortable with following up with her physician as an outpatient, is ready for discharge.   Gerhard Munch, MD 12/03/20 1728

## 2020-12-03 NOTE — ED Provider Notes (Signed)
[facility] EMERGENCY DEPARTMENT Provider Note   CSN: [id number] Arrival date & time: 12/03/20  1028     History Chief Complaint  Patient presents with  . Chest Pain    Amber [name] is a 20 y.o. female.  HPI  20 year old female G1, P1 currently menstruating presents today with complaints of chest pain.  She was seen here 2 days ago for some similar symptoms were thought to be an allergic reaction.  She was eating a [employer] and she began having some nausea, vomiting, diarrhea, shortness of breath with feeling that her throat was closing and her tongue was swelling as well as itching and hives.  The symptoms had resolved and she has continued on prednisone.  She states that this morning when she was going into work she felt that she had some sudden sternal discomfort that radiates to bilateral upper abdomen that is worse with deep breathing.  This is similar to pain she had the other day.  She denies any history of PE, DVT, upper abdominal abnormalities including cholelithiasis or gastritis.  She states that she is currently menstruating but it is not a normal time as she has had some irregular menses.  She does not think that she is pregnant states she is not currently sexually active.     Past Medical History:  Diagnosis Date  . Asthma   . PONV (postoperative nausea and vomiting)   . Skull fracture (HCC)    at 69 months old    Patient Active Problem List   Diagnosis Date Noted  . Acute blood loss anemia 05/16/2019  . Preterm labor 05/15/2019  . Preeclampsia 05/15/2019  . Pediatric pre-birth visit for expectant parent 04/30/2019    Past Surgical History:  Procedure Laterality Date  . INNER EAR SURGERY Left      OB History    Gravida  1   Para  1   Term      Preterm  1   AB      Living  1     SAB      IAB      Ectopic      Multiple  0   Live Births  1           Family History  Problem Relation Age of Onset  . Diabetes  Mother     Social History   Tobacco Use  . Smoking status: Never Smoker  . Smokeless tobacco: Never Used  Vaping Use  . Vaping Use: Never used  Substance Use Topics  . Alcohol use: Never  . Drug use: Never    Home Medications Prior to Admission medications   Medication Sig Start Date End Date Taking? Authorizing Provider  EPINEPHrine 0.3 mg/0.3 mL IJ SOAJ injection Inject 0.3 mg into the muscle as needed for anaphylaxis. 12/02/20  Yes McDonald, Mia A, PA-C  megestrol (MEGACE) 40 MG tablet Take 40 mg by mouth as needed. 11/28/20  Yes [provider]  predniSONE (DELTASONE) 10 MG tablet Take 6 tablets (60 mg total) by mouth daily for 1 day, THEN 5 tablets (50 mg total) daily for 1 day, THEN 4 tablets (40 mg total) daily for 1 day, THEN 3 tablets (30 mg total) daily for 1 day, THEN 2 tablets (20 mg total) daily for 1 day, THEN 1 tablet (10 mg total) daily for 1 day. 12/02/20 12/08/20 Yes McDonald, Mia A, PA-C  omeprazole (PRILOSEC) 20 MG capsule Take 1 capsule (20 mg  total) by mouth daily. Patient not taking: No sig reported 10/26/20   Geoffery Lyons, MD  ondansetron (ZOFRAN) 8 MG tablet Take 1 tablet (8 mg total) by mouth every 4 (four) hours as needed for nausea. Patient not taking: No sig reported 10/26/20   Geoffery Lyons, MD  ferrous sulfate (FERROUSUL) 325 (65 FE) MG tablet Take 1 tablet (325 mg total) by mouth daily with breakfast. Patient not taking: No sig reported 05/16/19 12/02/20  Rande Brunt, MD    Allergies    Patient has no known allergies.  Review of Systems   Review of Systems  All other systems reviewed and are negative.   Physical Exam Updated Vital Signs BP 112/71   Pulse 87   Temp 98.3 F (36.8 C) (Oral)   Resp 18   Ht 1.6 m (5\' 3" )   Wt 68 kg   SpO2 100%   BMI 26.57 kg/m   Physical Exam Vitals reviewed.  Constitutional:      Appearance: She is well-developed.  HENT:     Head: Normocephalic and atraumatic.  Eyes:     Pupils: Pupils  are equal, round, and reactive to light.  Cardiovascular:     Rate and Rhythm: Normal rate and regular rhythm.     Heart sounds: Normal heart sounds.  Pulmonary:     Effort: Pulmonary effort is normal.     Breath sounds: Normal breath sounds.  Abdominal:     General: Bowel sounds are normal.     Palpations: Abdomen is soft.  Musculoskeletal:        General: Normal range of motion.     Cervical back: Normal range of motion and neck supple.  Skin:    General: Skin is warm and dry.     Capillary Refill: Capillary refill takes less than 2 seconds.  Neurological:     General: No focal deficit present.     Mental Status: She is alert.     ED Results / Procedures / Treatments   Labs (all labs ordered are listed, but only abnormal results are displayed) Labs Reviewed  BASIC METABOLIC PANEL - Abnormal; Notable for the following components:      Result Value   Potassium 3.3 (*)    Chloride 112 (*)    CO2 20 (*)    Calcium 8.7 (*)    All other components within normal limits  CBC - Abnormal; Notable for the following components:   RBC 3.71 (*)    Hemoglobin 10.6 (*)    HCT 32.2 (*)    All other components within normal limits  D-DIMER, QUANTITATIVE - Abnormal; Notable for the following components:   D-Dimer, Quant 0.61 (*)    All other components within normal limits  I-STAT BETA HCG BLOOD, ED ([facility], [facility], [facility] ONLY)  TROPONIN I (HIGH SENSITIVITY)  TROPONIN I (HIGH SENSITIVITY)    EKG EKG Interpretation  Date/Time:  Sunday Dec 03 2020 10:39:45 EDT Ventricular Rate:  80 PR Interval:  128 QRS Duration: 98 QT Interval:  373 QTC Calculation: 431 R Axis:   72 Text Interpretation: Normal sinus rhythm Normal ECG Confirmed by 12-01-1990 [phone]) on 12/03/2020 4:02:32 PM   Radiology DG Chest 2 View  Result Date: 12/03/2020 CLINICAL DATA:  Chest pain beginning at work. Lightheadedness and shortness of breath. EXAM: CHEST - 2 VIEW COMPARISON:  None. FINDINGS: Normal heart,  mediastinum and hila. Clear lungs. No pleural effusion or pneumothorax. Skeletal structures are unremarkable. IMPRESSION: Normal chest radiographs. Electronically Signed  By: Amie Portland M.D.   On: 12/03/2020 11:05    Procedures Procedures   Medications Ordered in ED Medications  ketorolac (TORADOL) 15 MG/ML injection 15 mg (15 mg Intravenous Given 12/03/20 1142)    ED Course  I have reviewed the triage vital signs and the nursing notes.  Pertinent labs & imaging results that were available during my care of the patient were reviewed by me and considered in my medical decision making (see chart for details).    MDM Rules/Calculators/A&P                          20 year old female presents today complaining of pleuritic type chest pain radiating from substernal area to the bilateral upper abdomen. ACS-doubt normal ekg, trop and delta trop normal cxr without acute changes- no pneumo, consolidation, no mediastinal widening Due to pleuritic nature of pain, D-dimer obtained slightly elevated. Patient is having CTA chest. Labs are significant for mild anemia.  Patient has had some increased vaginal bleeding and is being followed for this.  Patient advised regarding need for follow-up. If this is normal, patient to be discharged home Discussed with Dr. Jeraldine Loots he has assumed care of patient Final Clinical Impression(s) / ED Diagnoses Final diagnoses:  Chest pain, unspecified type  Anemia, unspecified type    Rx / DC Orders ED Discharge Orders    None       Margarita Grizzle, MD 12/03/20 1607

## 2020-12-03 NOTE — ED Notes (Signed)
Patient transported to X-ray 

## 2020-12-03 NOTE — Discharge Instructions (Addendum)
No definitive cause was found for your chest pain Your EKG and heart enzymes were normal Had a CT look for blood clots in your lungs.  No acute abnormality was noted. You have some anemia.  This is likely due to your heavy menstrual cycles.  Currently is not low enough to need a of blood transfusion.  Please follow-up with your pharmacist to get iron.  Please follow-up with your doctor for recheck.

## 2020-12-03 NOTE — ED Notes (Signed)
Returned from CT.

## 2020-12-03 NOTE — ED Triage Notes (Signed)
BIB EMS for CP while at work, lightheaded, SOB, and tingling in legs. 324mg  aspirin en route. 7/10 pain. Seen 2 days prior for anaphylaxis and currently on prednisone titrated pack.

## 2020-12-04 DIAGNOSIS — Z91018 Allergy to other foods: Secondary | ICD-10-CM | POA: Insufficient documentation

## 2020-12-05 ENCOUNTER — Emergency Department
Admission: EM | Admit: 2020-12-05 | Discharge: 2020-12-05 | Disposition: A | Discharge status: Home / Self Care | Payer: 59 | Attending: Emergency Medicine | Admitting: Emergency Medicine

## 2020-12-05 ENCOUNTER — Other Ambulatory Visit: Payer: Self-pay

## 2020-12-05 DIAGNOSIS — J45909 Unspecified asthma, uncomplicated: Secondary | ICD-10-CM | POA: Diagnosis not present

## 2020-12-05 DIAGNOSIS — R21 Rash and other nonspecific skin eruption: Secondary | ICD-10-CM | POA: Diagnosis not present

## 2020-12-05 DIAGNOSIS — R0602 Shortness of breath: Secondary | ICD-10-CM | POA: Diagnosis not present

## 2020-12-05 DIAGNOSIS — T782XXA Anaphylactic shock, unspecified, initial encounter: Secondary | ICD-10-CM | POA: Diagnosis not present

## 2020-12-05 DIAGNOSIS — T7840XA Allergy, unspecified, initial encounter: Secondary | ICD-10-CM | POA: Diagnosis present

## 2020-12-05 DIAGNOSIS — R42 Dizziness and giddiness: Secondary | ICD-10-CM | POA: Diagnosis not present

## 2020-12-05 DIAGNOSIS — R000 Tachycardia, unspecified: Secondary | ICD-10-CM | POA: Insufficient documentation

## 2020-12-05 MED ORDER — EPINEPHRINE 0.3 MG/0.3ML IJ SOAJ: 0.3000 mg | INTRAMUSCULAR | 0 refills | Status: DC | PRN

## 2020-12-05 MED ORDER — LACTATED RINGERS IV BOLUS
1000.0000 mL | Freq: Once | INTRAVENOUS | Status: AC
  Administered 2020-12-05: 1000 mL via INTRAVENOUS

## 2020-12-05 MED ORDER — FAMOTIDINE IN NACL 20-0.9 MG/50ML-% IV SOLN
20.0000 mg | Freq: Once | INTRAVENOUS | Status: AC
  Administered 2020-12-05: 20 mg via INTRAVENOUS
  Filled 2020-12-05: qty 50

## 2020-12-05 MED ORDER — EPINEPHRINE 0.3 MG/0.3ML IJ SOAJ
0.3000 mg | Freq: Once | INTRAMUSCULAR | Status: AC
  Administered 2020-12-05: 0.3 mg via INTRAMUSCULAR
  Filled 2020-12-05: qty 0.3

## 2020-12-05 MED ORDER — PROCHLORPERAZINE EDISYLATE 10 MG/2ML IJ SOLN
10.0000 mg | Freq: Once | INTRAMUSCULAR | Status: AC
  Administered 2020-12-05: 10 mg via INTRAVENOUS
  Filled 2020-12-05: qty 2

## 2020-12-05 MED ORDER — DEXAMETHASONE SODIUM PHOSPHATE 10 MG/ML IJ SOLN
6.0000 mg | Freq: Once | INTRAMUSCULAR | Status: AC
  Administered 2020-12-05: 6 mg via INTRAVENOUS
  Filled 2020-12-05: qty 1

## 2020-12-05 MED ORDER — DIPHENHYDRAMINE HCL 50 MG/ML IJ SOLN
50.0000 mg | Freq: Once | INTRAMUSCULAR | Status: AC
  Administered 2020-12-05: 50 mg via INTRAVENOUS
  Filled 2020-12-05: qty 1

## 2020-12-05 NOTE — ED Provider Notes (Signed)
[facility] Emergency Department Provider Note  ____________________________________________   Event Date/Time   First MD Initiated Contact with Patient 12/05/20 [phone]     (approximate)  I have reviewed the triage vital signs and the nursing notes.   HISTORY  Chief Complaint Allergic Reaction (Pt presenting via EMS from home. Scattered hives, itching since around 5am. SOB and lightheadedness when sitting up. Pt reports she took 50mg  benadryl prior to EMS arrival. EMS gave pt 0.3 epi and 4mg  zofran IM. Hx of same on Friday and went to [facility]. Pt unsure of allergy)   HPI Cieanna A Virden is a 20 y.o. female with a past medical history of asthma who presents for assessment of acute onset of generalized itchy rash associated with lightheadedness and shortness of breath that woke her from sleep early this morning around 5 AM.  She states that she felt dizzy and lightheaded and almost passed out.  She states she did not pass out and took 50 mg of Benadryl and received IM epinephrine and Zofran with EMS and now feels like her rash and dizziness is much better but she has little bit of a headache.  She states this is almost exactly what happened 2 days ago on 5/8 when she developed very similar symptoms but actually did pass out while in the bathroom at 35.  She states that at that time she went to [facility] where she received similar treatment and also got a CTA of her chest that was unremarkable as well as blood work that did not show any significant allergies.  She states she does not have any known allergies and cannot think of any new foods chemical soaps or other environmental exposures could have caused this.  She states she has a follow-up with an allergist in June and got a prescription for EpiPen but has not been able to fill it yet.  She has not had any cough, abdominal pain, back pain, chest pain, vomiting, nausea, diarrhea, dysuria or recent  injuries or falls.  No vertigo, vision changes or any other clear associated symptoms.  No other acute concerns at this time.         Past Medical History:  Diagnosis Date  . Asthma   . PONV (postoperative nausea and vomiting)   . Skull fracture (HCC)    at 26 months old    Patient Active Problem List   Diagnosis Date Noted  . Acute blood loss anemia 05/16/2019  . Preterm labor 05/15/2019  . Preeclampsia 05/15/2019  . Pediatric pre-birth visit for expectant parent 04/30/2019    Past Surgical History:  Procedure Laterality Date  . INNER EAR SURGERY Left     Prior to Admission medications   Medication Sig Start Date End Date Taking? Authorizing Provider  diphenhydrAMINE (BENADRYL) 25 mg capsule Take 25 capsules by mouth every 6 (six) hours as needed.   Yes [provider]  EPINEPHrine 0.3 mg/0.3 mL IJ SOAJ injection Inject 0.3 mg into the muscle as needed for anaphylaxis. 12/02/20  Yes McDonald, Mia A, PA-C  EPINEPHrine 0.3 mg/0.3 mL IJ SOAJ injection Inject 0.3 mg into the muscle as needed for anaphylaxis. 12/05/20  Yes 02/01/21, MD  predniSONE (DELTASONE) 10 MG tablet Take 6 tablets (60 mg total) by mouth daily for 1 day, THEN 5 tablets (50 mg total) daily for 1 day, THEN 4 tablets (40 mg total) daily for 1 day, THEN 3 tablets (30 mg total) daily for  1 day, THEN 2 tablets (20 mg total) daily for 1 day, THEN 1 tablet (10 mg total) daily for 1 day. 12/02/20 12/08/20 Yes McDonald, Mia A, PA-C  megestrol (MEGACE) 40 MG tablet Take 40 mg by mouth as needed. Patient not taking: Reported on 12/05/2020 11/28/20   [provider]  omeprazole (PRILOSEC) 20 MG capsule Take 1 capsule (20 mg total) by mouth daily. Patient not taking: No sig reported 10/26/20   Geoffery Lyons, MD  ondansetron (ZOFRAN) 8 MG tablet Take 1 tablet (8 mg total) by mouth every 4 (four) hours as needed for nausea. Patient not taking: No sig reported 10/26/20   Geoffery Lyons, MD  ferrous sulfate  (FERROUSUL) 325 (65 FE) MG tablet Take 1 tablet (325 mg total) by mouth daily with breakfast. Patient not taking: No sig reported 05/16/19 12/02/20  Rande Brunt, MD    Allergies Patient has no known allergies.  Family History  Problem Relation Age of Onset  . Diabetes Mother     Social History Social History   Tobacco Use  . Smoking status: Never Smoker  . Smokeless tobacco: Never Used  Vaping Use  . Vaping Use: Never used  Substance Use Topics  . Alcohol use: Never  . Drug use: Never    Review of Systems  Review of Systems  Constitutional: Negative for chills and fever.  HENT: Negative for sore throat.   Eyes: Negative for pain.  Respiratory: Positive for shortness of breath. Negative for cough and stridor.   Cardiovascular: Negative for chest pain.  Gastrointestinal: Negative for vomiting.  Genitourinary: Negative for dysuria.  Skin: Positive for itching and rash.  Neurological: Positive for dizziness. Negative for seizures, loss of consciousness and headaches.  Psychiatric/Behavioral: Negative for suicidal ideas.  All other systems reviewed and are negative.     ____________________________________________   PHYSICAL EXAM:  VITAL SIGNS: ED Triage Vitals [12/05/20 0648]  Enc Vitals Group     BP 124/77     Pulse Rate (!) 104     Resp 14     Temp 98.3 F (36.8 C)     Temp Source Oral     SpO2 100 %     Weight 150 lb (68 kg)     Height 5\' 3"  (1.6 m)     Head Circumference      Peak Flow      Pain Score 4     Pain Loc      Pain Edu?      Excl. in GC?    Vitals:   12/05/20 1030 12/05/20 1100  BP: 112/68 108/68  Pulse: 88 88  Resp: 17 17  Temp:    SpO2: 98% 99%   Physical Exam Vitals and nursing note reviewed.  Constitutional:      General: She is not in acute distress.    Appearance: She is well-developed.  HENT:     Head: Normocephalic and atraumatic.     Right Ear: External ear normal.     Left Ear: External ear normal.      Nose: Nose normal.  Eyes:     Conjunctiva/sclera: Conjunctivae normal.  Cardiovascular:     Rate and Rhythm: Regular rhythm. Tachycardia present.     Heart sounds: No murmur heard.   Pulmonary:     Effort: Pulmonary effort is normal. No respiratory distress.     Breath sounds: Normal breath sounds.  Abdominal:     Palpations: Abdomen is soft.     Tenderness:  There is no abdominal tenderness.  Musculoskeletal:     Cervical back: Neck supple.  Skin:    General: Skin is warm and dry.     Capillary Refill: Capillary refill takes less than 2 seconds.  Neurological:     Mental Status: She is alert and oriented to person, place, and time.  Psychiatric:        Mood and Affect: Mood normal.      ____________________________________________   LABS (all labs ordered are listed, but only abnormal results are displayed)  Labs Reviewed  POC URINE PREG, ED   ____________________________________________  EKG  ____________________________________________  RADIOLOGY  ED MD interpretation:    Official radiology report(s): No results found.  ____________________________________________   PROCEDURES  Procedure(s) performed (including Critical Care):  .Critical Care Performed by: Gilles Chiquito, MD Authorized by: Gilles Chiquito, MD   Critical care provider statement:    Critical care time (minutes):  45   Critical care was necessary to treat or prevent imminent or life-threatening deterioration of the following conditions: anaphylaxis    Critical care was time spent personally by me on the following activities:  Discussions with consultants, evaluation of patient's response to treatment, examination of patient, ordering and performing treatments and interventions, ordering and review of laboratory studies, ordering and review of radiographic studies, pulse oximetry, re-evaluation of patient's condition, obtaining history from patient or surrogate and review of old  charts     ____________________________________________   INITIAL IMPRESSION / ASSESSMENT AND PLAN / ED COURSE      Patient presents with above to history exam after she awoke around 5 AM with diffuse itchy rash consistent with lightheadedness shortness of breath.  She did receive 50 mg of Benadryl 0.3 IM epinephrine and 4 g of Zofran by EMS prior to arrival.  She states her itchiness and rash as well as lightheadedness is much better but she is little bit of a headache.  On arrival she is slightly tachycardic with otherwise stable vital signs on room air.  She is a little bit of lateral erythema over bilateral upper extremities chest and face but no significant raised plaques.  No stridor or wheezing and oropharynx is unremarkable.  Impression is likely anaphylaxis although unclear source.  Low suspicion for significant metabolic derangement, PE, ACS, dissection or acute infectious process given overall presentation and response to medicines and extensive work-up on 5//H which was reviewed by myself including lab work and CTA chest.  On arrival patient given IV fluids Decadron and Pepcid.  After approximately 30 minutes to 40 minutes she stated her rash started feeling it was returning with symptoms of more maculopapules erythematous and blanchable consistent with returning urticaria.  She was given a second dose of epinephrine.  She was observed for an additional 3 hours and had no return of symptoms states she felt much better.  He was given a paper prescription for EpiPen's as well as additional allergy referral so she can get in sooner than June.  Discharged stable condition.  Strict return precautions advised and discussed.  ____________________________________________   FINAL CLINICAL IMPRESSION(S) / ED DIAGNOSES  Final diagnoses:  Anaphylaxis, initial encounter    Medications  lactated ringers bolus 1,000 mL (0 mLs Intravenous Stopped 12/05/20 0920)  dexamethasone (DECADRON)  injection 6 mg (6 mg Intravenous Given 12/05/20 0726)  diphenhydrAMINE (BENADRYL) injection 50 mg (50 mg Intravenous Given 12/05/20 0744)  famotidine (PEPCID) IVPB 20 mg premix (0 mg Intravenous Stopped 12/05/20 0808)  EPINEPHrine (EPI-PEN) injection 0.3  mg (0.3 mg Intramuscular Given 12/05/20 0801)  prochlorperazine (COMPAZINE) injection 10 mg (10 mg Intravenous Given 12/05/20 0808)     ED Discharge Orders         Ordered    EPINEPHrine 0.3 mg/0.3 mL IJ SOAJ injection  As needed        12/05/20 1114           Note:  This document was prepared using Dragon voice recognition software and may include unintentional dictation errors.   Gilles ChiquitoSmith, Tamyah Cutbirth P, MD 12/05/20 1256

## 2020-12-05 NOTE — ED Notes (Signed)
Messaged Dr Katrinka Blazing per pt request, pt c/o "intense itching", requests dose of benadryl. Awaiting new orders. Pt denies sob, respirations regular and unlabored. Continuous cardiac and pulse ox monitoring in use.

## 2020-12-05 NOTE — ED Notes (Addendum)
Chief Complaint  Patient presents with  . Allergic Reaction    Pt presenting via EMS from home. Scattered hives, itching since around 5am. SOB and lightheadedness when sitting up. Pt reports she took 50mg  benadryl prior to EMS arrival. EMS gave pt 0.3 epi and 4mg  zofran IM. Hx of same on Friday and went to [facility]. Pt unsure of allergy   Pt presenting with the above complaint. Pt speaking in clear, concise sentences. Denies airway issues. SpO2 100%. States this is how she felt on Friday but on Friday she passed out. Unsure of allergy. Pt denies new foods, new medications, new lotions, perfumes, or detergents. -nausea

## 2020-12-05 NOTE — ED Notes (Signed)
Messaged Dr Katrinka Blazing about pt c/o nausea. Awaiting orders.

## 2020-12-05 NOTE — ED Notes (Signed)
Meds given, see emar. Pt states "I feel like it's getting worse, this is the worst its ever been". Pt has visible hives on bilateral arms, legs, and trunk. She still denies sob, respirations regular and unlabored. Continuous oxygen and cardiac monitoring in use. Will notify Dr Katrinka Blazing of patient status.

## 2020-12-05 NOTE — ED Notes (Signed)
Pt resting, easily aroused. Reports feeling better, hives are gone and skin is less erythematous. Given ginger ale, denies further needs.

## 2020-12-12 DIAGNOSIS — Z91018 Allergy to other foods: Secondary | ICD-10-CM | POA: Diagnosis not present

## 2020-12-12 DIAGNOSIS — R1013 Epigastric pain: Secondary | ICD-10-CM | POA: Diagnosis not present

## 2020-12-12 DIAGNOSIS — R109 Unspecified abdominal pain: Secondary | ICD-10-CM | POA: Diagnosis not present

## 2021-01-10 DIAGNOSIS — L501 Idiopathic urticaria: Secondary | ICD-10-CM | POA: Diagnosis not present

## 2021-01-10 DIAGNOSIS — T781XXD Other adverse food reactions, not elsewhere classified, subsequent encounter: Secondary | ICD-10-CM | POA: Diagnosis not present

## 2021-01-10 DIAGNOSIS — R059 Cough, unspecified: Secondary | ICD-10-CM | POA: Diagnosis not present

## 2021-01-10 DIAGNOSIS — T783XXD Angioneurotic edema, subsequent encounter: Secondary | ICD-10-CM | POA: Diagnosis not present

## 2021-01-12 DIAGNOSIS — T781XXA Other adverse food reactions, not elsewhere classified, initial encounter: Secondary | ICD-10-CM | POA: Diagnosis not present

## 2021-03-23 DIAGNOSIS — G43019 Migraine without aura, intractable, without status migrainosus: Secondary | ICD-10-CM | POA: Diagnosis not present

## 2021-04-25 DIAGNOSIS — G43019 Migraine without aura, intractable, without status migrainosus: Secondary | ICD-10-CM | POA: Diagnosis not present

## 2021-05-14 DIAGNOSIS — Z299 Encounter for prophylactic measures, unspecified: Secondary | ICD-10-CM | POA: Diagnosis not present

## 2021-05-14 DIAGNOSIS — R059 Cough, unspecified: Secondary | ICD-10-CM | POA: Diagnosis not present

## 2021-05-14 DIAGNOSIS — T783XXD Angioneurotic edema, subsequent encounter: Secondary | ICD-10-CM | POA: Diagnosis not present

## 2021-05-14 DIAGNOSIS — L501 Idiopathic urticaria: Secondary | ICD-10-CM | POA: Diagnosis not present

## 2021-06-20 DIAGNOSIS — Z299 Encounter for prophylactic measures, unspecified: Secondary | ICD-10-CM | POA: Diagnosis not present

## 2021-06-20 DIAGNOSIS — T783XXD Angioneurotic edema, subsequent encounter: Secondary | ICD-10-CM | POA: Diagnosis not present

## 2021-06-20 DIAGNOSIS — L501 Idiopathic urticaria: Secondary | ICD-10-CM | POA: Diagnosis not present

## 2021-06-20 DIAGNOSIS — R059 Cough, unspecified: Secondary | ICD-10-CM | POA: Diagnosis not present

## 2021-07-16 DIAGNOSIS — Z113 Encounter for screening for infections with a predominantly sexual mode of transmission: Secondary | ICD-10-CM | POA: Diagnosis not present

## 2021-07-16 DIAGNOSIS — Z124 Encounter for screening for malignant neoplasm of cervix: Secondary | ICD-10-CM | POA: Diagnosis not present

## 2021-07-16 DIAGNOSIS — Z0000 Encounter for general adult medical examination without abnormal findings: Secondary | ICD-10-CM | POA: Diagnosis not present

## 2021-09-02 ENCOUNTER — Emergency Department: Payer: Medicaid Other

## 2021-09-02 ENCOUNTER — Other Ambulatory Visit: Payer: Self-pay

## 2021-09-02 ENCOUNTER — Emergency Department
Admission: EM | Admit: 2021-09-02 | Discharge: 2021-09-02 | Disposition: A | Discharge status: Home / Self Care | Payer: Medicaid Other | Attending: Emergency Medicine | Admitting: Emergency Medicine

## 2021-09-02 ENCOUNTER — Encounter: Payer: Self-pay | Admitting: Emergency Medicine

## 2021-09-02 DIAGNOSIS — N83291 Other ovarian cyst, right side: Secondary | ICD-10-CM | POA: Insufficient documentation

## 2021-09-02 DIAGNOSIS — N730 Acute parametritis and pelvic cellulitis: Secondary | ICD-10-CM

## 2021-09-02 DIAGNOSIS — R109 Unspecified abdominal pain: Secondary | ICD-10-CM | POA: Diagnosis not present

## 2021-09-02 DIAGNOSIS — N739 Female pelvic inflammatory disease, unspecified: Secondary | ICD-10-CM | POA: Diagnosis not present

## 2021-09-02 DIAGNOSIS — R1031 Right lower quadrant pain: Secondary | ICD-10-CM

## 2021-09-02 DIAGNOSIS — R000 Tachycardia, unspecified: Secondary | ICD-10-CM | POA: Insufficient documentation

## 2021-09-02 DIAGNOSIS — N83201 Unspecified ovarian cyst, right side: Secondary | ICD-10-CM | POA: Diagnosis not present

## 2021-09-02 DIAGNOSIS — J45909 Unspecified asthma, uncomplicated: Secondary | ICD-10-CM | POA: Diagnosis not present

## 2021-09-02 DIAGNOSIS — R102 Pelvic and perineal pain: Secondary | ICD-10-CM | POA: Diagnosis not present

## 2021-09-02 LAB — URINALYSIS, ROUTINE W REFLEX MICROSCOPIC
Bacteria, UA: NONE SEEN
Bilirubin Urine: NEGATIVE
Glucose, UA: NEGATIVE mg/dL
Hgb urine dipstick: NEGATIVE
Ketones, ur: NEGATIVE mg/dL
Nitrite: NEGATIVE
Protein, ur: NEGATIVE mg/dL
Specific Gravity, Urine: 1.01 (ref 1.005–1.030)
pH: 7 (ref 5.0–8.0)

## 2021-09-02 LAB — WET PREP, GENITAL
Clue Cells Wet Prep HPF POC: NONE SEEN
Sperm: NONE SEEN
Trich, Wet Prep: NONE SEEN
WBC, Wet Prep HPF POC: >=10 — AB (ref <10)
Yeast Wet Prep HPF POC: NONE SEEN

## 2021-09-02 LAB — COMPREHENSIVE METABOLIC PANEL
ALT: 23 U/L (ref 0–44)
AST: 34 U/L (ref 15–41)
Albumin: 4.1 g/dL (ref 3.5–5.0)
Alkaline Phosphatase: 69 U/L (ref 38–126)
Anion gap: 6 (ref 5–15)
BUN: 11 mg/dL (ref 6–20)
CO2: 21 mmol/L — ABNORMAL LOW (ref 22–32)
Calcium: 9.1 mg/dL (ref 8.9–10.3)
Chloride: 108 mmol/L (ref 98–111)
Creatinine, Ser: 0.8 mg/dL (ref 0.44–1.00)
GFR, Estimated: >60 mL/min (ref >60)
Glucose, Bld: 88 mg/dL (ref 70–99)
Potassium: 4.6 mmol/L (ref 3.5–5.1)
Sodium: 135 mmol/L (ref 135–145)
Total Bilirubin: 1.4 mg/dL — ABNORMAL HIGH (ref 0.3–1.2)
Total Protein: 7.8 g/dL (ref 6.5–8.1)

## 2021-09-02 LAB — CBC WITH DIFFERENTIAL/PLATELET
Abs Immature Granulocytes: 0.07 10*3/uL (ref 0.00–0.07)
Basophils Absolute: 0.1 10*3/uL (ref 0.0–0.1)
Basophils Relative: 0 %
Eosinophils Absolute: 0.1 10*3/uL (ref 0.0–0.5)
Eosinophils Relative: 1 %
HCT: 41.8 % (ref 36.0–46.0)
Hemoglobin: 14 g/dL (ref 12.0–15.0)
Immature Granulocytes: 1 %
Lymphocytes Relative: 31 %
Lymphs Abs: 4.4 10*3/uL — ABNORMAL HIGH (ref 0.7–4.0)
MCH: 28.1 pg (ref 26.0–34.0)
MCHC: 33.5 g/dL (ref 30.0–36.0)
MCV: 83.9 fL (ref 80.0–100.0)
Monocytes Absolute: 0.7 10*3/uL (ref 0.1–1.0)
Monocytes Relative: 5 %
Neutro Abs: 8.7 10*3/uL — ABNORMAL HIGH (ref 1.7–7.7)
Neutrophils Relative %: 62 %
Platelets: 232 10*3/uL (ref 150–400)
RBC: 4.98 MIL/uL (ref 3.87–5.11)
RDW: 14.3 % (ref 11.5–15.5)
WBC: 14 10*3/uL — ABNORMAL HIGH (ref 4.0–10.5)
nRBC: 0 % (ref 0.0–0.2)

## 2021-09-02 LAB — LIPASE, BLOOD: Lipase: 28 U/L (ref 11–51)

## 2021-09-02 LAB — CHLAMYDIA/NGC RT PCR (ARMC ONLY)
Chlamydia Tr: NOT DETECTED
N gonorrhoeae: NOT DETECTED

## 2021-09-02 LAB — PREGNANCY, URINE: Preg Test, Ur: NEGATIVE

## 2021-09-02 MED ORDER — ACETAMINOPHEN 500 MG PO TABS
1000.0000 mg | ORAL_TABLET | Freq: Once | ORAL | Status: AC
  Administered 2021-09-02: 1000 mg via ORAL
  Filled 2021-09-02: qty 2

## 2021-09-02 MED ORDER — SODIUM CHLORIDE 0.9 % IV SOLN
100.0000 mg | Freq: Once | INTRAVENOUS | Status: AC
  Administered 2021-09-02: 100 mg via INTRAVENOUS
  Filled 2021-09-02: qty 100

## 2021-09-02 MED ORDER — METRONIDAZOLE 500 MG/100ML IV SOLN
500.0000 mg | Freq: Once | INTRAVENOUS | Status: AC
  Administered 2021-09-02: 500 mg via INTRAVENOUS
  Filled 2021-09-02: qty 100

## 2021-09-02 MED ORDER — MORPHINE SULFATE (PF) 4 MG/ML IV SOLN
4.0000 mg | Freq: Once | INTRAVENOUS | Status: AC
  Administered 2021-09-02: 4 mg via INTRAVENOUS
  Filled 2021-09-02: qty 1

## 2021-09-02 MED ORDER — OXYCODONE HCL 5 MG PO TABS
5.0000 mg | ORAL_TABLET | Freq: Three times a day (TID) | ORAL | 0 refills | Status: AC | PRN
Start: 2021-09-02 — End: 2022-09-02

## 2021-09-02 MED ORDER — METRONIDAZOLE 500 MG PO TABS: 500.0000 mg | ORAL_TABLET | Freq: Two times a day (BID) | ORAL | 0 refills | Status: AC

## 2021-09-02 MED ORDER — ONDANSETRON HCL 4 MG/2ML IJ SOLN
4.0000 mg | Freq: Once | INTRAMUSCULAR | Status: AC
  Administered 2021-09-02: 4 mg via INTRAVENOUS
  Filled 2021-09-02: qty 2

## 2021-09-02 MED ORDER — CEFTRIAXONE SODIUM 1 G IJ SOLR
1.0000 g | Freq: Once | INTRAMUSCULAR | Status: AC
  Administered 2021-09-02: 1 g via INTRAVENOUS
  Filled 2021-09-02: qty 10

## 2021-09-02 MED ORDER — KETOROLAC TROMETHAMINE 30 MG/ML IJ SOLN
15.0000 mg | Freq: Once | INTRAMUSCULAR | Status: AC
  Administered 2021-09-02: 15 mg via INTRAVENOUS
  Filled 2021-09-02: qty 1

## 2021-09-02 MED ORDER — LACTATED RINGERS IV BOLUS
1000.0000 mL | Freq: Once | INTRAVENOUS | Status: AC
  Administered 2021-09-02: 1000 mL via INTRAVENOUS

## 2021-09-02 MED ORDER — IOHEXOL 300 MG/ML  SOLN
100.0000 mL | Freq: Once | INTRAMUSCULAR | Status: AC | PRN
  Administered 2021-09-02: 100 mL via INTRAVENOUS

## 2021-09-02 MED ORDER — ONDANSETRON 4 MG PO TBDP: 4.0000 mg | ORAL_TABLET | Freq: Three times a day (TID) | ORAL | 0 refills | Status: DC | PRN

## 2021-09-02 MED ORDER — ONDANSETRON 4 MG PO TBDP
4.0000 mg | ORAL_TABLET | Freq: Once | ORAL | Status: AC
  Administered 2021-09-02: 4 mg via ORAL
  Filled 2021-09-02: qty 1

## 2021-09-02 MED ORDER — OXYCODONE HCL 5 MG PO TABS
5.0000 mg | ORAL_TABLET | Freq: Once | ORAL | Status: AC
Start: 2021-09-02 — End: 2021-09-02
  Administered 2021-09-02: 5 mg via ORAL
  Filled 2021-09-02: qty 1

## 2021-09-02 MED ORDER — DOXYCYCLINE HYCLATE 100 MG PO TABS: 100.0000 mg | ORAL_TABLET | Freq: Two times a day (BID) | ORAL | 0 refills | Status: AC

## 2021-09-02 NOTE — Discharge Instructions (Addendum)
I suspect you have pelvic inflammatory disease causing the discharge from your vagina and the pain to your lower abdomen.  You are being discharged with 2 drip antibiotics to take twice daily for the next 2 weeks.  Please finish all antibiotics, even if your symptoms are getting better.  Please take Tylenol and ibuprofen/Advil for your pain.  It is safe to take them together, or to alternate them every few hours.  Take up to 1000mg  of Tylenol at a time, up to 4 times per day.  Do not take more than 4000 mg of Tylenol in 24 hours.  For ibuprofen, take 400-600 mg, 4-5 times per day.  Use the Zofran as needed for nausea and vomiting.  Use oxycodone as needed for more severe/breakthrough pain.  Do not drive or work while taking this medication as it can make you sleepy.  Reach out to the clinic of the OB/GYN to be seen in the next week or so for follow-up.  If you develop any significantly worsening symptoms despite these measures then please return to the ED.

## 2021-09-02 NOTE — ED Provider Notes (Signed)
[facility] Provider Note    Event Date/Time   First MD Initiated Contact with Patient 09/02/21 1456     (approximate)   History   Abdominal Pain   HPI  Amber [name] is a 21 y.o. female who presents to the ED for evaluation of Abdominal Pain   I reviewed PCP visit from 03/2021.  History of migraines, GERD and asthma. G1, P1 on OCPs.  Patient presents to the ED for evaluation of vaginal discharge, nausea, emesis and RLQ abdominal pain.  She reports about 3 days of novel vaginal discharge that is like mucus.  About 2 weeks since her LMP.  She is sexually active with one female.  No known STD exposure.  Reports feeling nauseous last night and having 1 episode of nonbloody nonbilious emesis this morning.  She reports RLQ pain since earlier this morning, few hours of moderately severe RLQ pain that is been constant and nonradiating.  Denies stool changes, dysuria, fever, syncope  Physical Exam   Triage Vital Signs: ED Triage Vitals  Enc Vitals Group     BP 09/02/21 1449 120/71     Pulse Rate 09/02/21 1449 97     Resp 09/02/21 1449 18     Temp 09/02/21 1449 98 F (36.7 C)     Temp Source 09/02/21 1449 Oral     SpO2 09/02/21 1449 100 %     Weight 09/02/21 1447 160 lb (72.6 kg)     Height 09/02/21 1447 5\' 3"  (1.6 m)     Head Circumference --      Peak Flow --      Pain Score 09/02/21 1447 7     Pain Loc --      Pain Edu? --      Excl. in GC? --     Most recent vital signs: Vitals:   09/02/21 2030 09/02/21 2100  BP: 117/75 103/62  Pulse: 84 83  Resp: 18 16  Temp:    SpO2: 98% 98%    General: Awake, no distress.  Appears uncomfortable. CV:  Good peripheral perfusion. RRR Resp:  Normal effort. CTAB Abd:  No distention.  RLQ and suprapubic tenderness primarily with voluntary guarding.  Rovsing's positive.  Lesser tenderness to the upper abdomen, does precipitate some lower and RLQ tenderness when I palpate the upper abdomen. MSK:  No  deformity noted.  Neuro:  No focal deficits appreciated. Other:  Pelvic exam with mucoid discharge from a closed cervix.  Erythematous and irritated cervix with CMT present.   ED Results / Procedures / Treatments   Labs (all labs ordered are listed, but only abnormal results are displayed) Labs Reviewed  WET PREP, GENITAL - Abnormal; Notable for the following components:      Result Value   WBC, Wet Prep HPF POC >=10 (*)    All other components within normal limits  CBC WITH DIFFERENTIAL/PLATELET - Abnormal; Notable for the following components:   WBC 14.0 (*)    Neutro Abs 8.7 (*)    Lymphs Abs 4.4 (*)    All other components within normal limits  COMPREHENSIVE METABOLIC PANEL - Abnormal; Notable for the following components:   CO2 21 (*)    Total Bilirubin 1.4 (*)    All other components within normal limits  URINALYSIS, ROUTINE W REFLEX MICROSCOPIC - Abnormal; Notable for the following components:   Leukocytes,Ua SMALL (*)    All other components within normal limits  CHLAMYDIA/NGC RT PCR ([facility] ONLY)  LIPASE, BLOOD  PREGNANCY, URINE    EKG   RADIOLOGY I review CT abdomen/pelvis with cyst on the right ovary I review follow-up transvaginal ultrasound with similar redemonstration of cyst on the right ovary  Official radiology report(s): CT ABDOMEN PELVIS W CONTRAST  Result Date: 09/02/2021 CLINICAL DATA:  21 year old female with RIGHT abdominal and pelvic pain. EXAM: CT ABDOMEN AND PELVIS WITH CONTRAST TECHNIQUE: Multidetector CT imaging of the abdomen and pelvis was performed using the standard protocol following bolus administration of intravenous contrast. RADIATION DOSE REDUCTION: This exam was performed according to the departmental dose-optimization program which includes automated exposure control, adjustment of the mA and/or kV according to patient size and/or use of iterative reconstruction technique. CONTRAST:  OMNIPAQUE IOHEXOL 300 MG/ML  SOLN  COMPARISON:  None. FINDINGS: Lower chest: No acute abnormality. Hepatobiliary: The liver and gallbladder unremarkable. There is no evidence of intrahepatic or extrahepatic biliary dilatation. Pancreas: Unremarkable Spleen: Unremarkable Adrenals/Urinary Tract: The kidneys, adrenal glands and bladder are unremarkable. Stomach/Bowel: Stomach is within normal limits. Appendix appears normal. No evidence of bowel wall thickening, distention, or inflammatory changes. Vascular/Lymphatic: No significant vascular findings are present. No enlarged abdominal or pelvic lymph nodes. Reproductive: A 3 cm RIGHT adnexal/ovarian cyst is noted. A small amount free fluid within the pelvis is identified. The uterus and LEFT adnexal regions are unremarkable. Other: No focal collection or pneumoperitoneum identified. Musculoskeletal: No acute or suspicious bony abnormalities are noted. IMPRESSION: 1. 3 cm RIGHT adnexal/ovarian cyst with small amount of free pelvic fluid. This may represent a hemorrhagic cyst. Consider pelvic ultrasound for further evaluation. 2. No other significant abnormalities.  Normal appendix. Electronically Signed   By: Harmon Pier M.D.   On: 09/02/2021 16:58   US PELVIC COMPLETE W TRANSVAGINAL AND TORSION R/O  Result Date: 09/02/2021 CLINICAL DATA:  21 year old female with acute pelvic pain for 2 days. RIGHT adnexal cyst identified on recent CT. EXAM: TRANSABDOMINAL AND TRANSVAGINAL ULTRASOUND OF PELVIS DOPPLER ULTRASOUND OF OVARIES TECHNIQUE: Both transabdominal and transvaginal ultrasound examinations of the pelvis were performed. Transabdominal technique was performed for global imaging of the pelvis including uterus, ovaries, adnexal regions, and pelvic cul-de-sac. It was necessary to proceed with endovaginal exam following the transabdominal exam to visualize the ovaries and endometrium. Color and duplex Doppler ultrasound was utilized to evaluate blood flow to the ovaries. COMPARISON:  09/02/2021 CT  FINDINGS: Uterus Measurements: 6.8 x 3.5 x 4.3 cm = volume: 52 mL. No fibroids or other mass visualized. Endometrium Thickness: 6 mm.  No focal abnormality visualized. Right ovary Measurements: 4.4 x 3.1 x 2.8 cm = volume: 19 mL. A 4 x 3.5 x 2.9 cm cyst containing a reticular pattern of internal echoes and without internal vascular flow is compatible with a hemorrhagic cyst. Left ovary Measurements: 2.7 x 1.7 x 2 cm = volume: 4.9 mL. Normal appearance/no adnexal mass. Pulsed Doppler evaluation of both ovaries demonstrates normal low-resistance arterial and venous waveforms. Other findings A small amount of free pelvic fluid is noted. IMPRESSION: 1. 4 cm RIGHT ovarian hemorrhagic cyst with small amount of free pelvic fluid. 2. No other abnormalities identified. No evidence of ovarian torsion. Electronically Signed   By: Harmon Pier M.D.   On: 09/02/2021 18:40    PROCEDURES and INTERVENTIONS:  Ultrasound ED Peripheral IV (Provider)  Date/Time: 09/02/2021 3:57 PM Performed by: Delton Prairie, MD Authorized by: Delton Prairie, MD   Procedure details:    Indications: multiple failed IV attempts     Skin Prep: chlorhexidine gluconate  Location: right basilic v.   Angiocath:  18 G   Bedside Ultrasound Guided: Yes     Images: not archived     Patient tolerated procedure without complications: Yes     Dressing applied: Yes   .Critical Care Performed by: Delton PrairieSmith, Mallarie Voorhies, MD Authorized by: Delton PrairieSmith, Shaylyn Bawa, MD   Critical care provider statement:    Critical care time (minutes):  30   Critical care time was exclusive of:  Separately billable procedures and treating other patients   Critical care was necessary to treat or prevent imminent or life-threatening deterioration of the following conditions:  Sepsis   Critical care was time spent personally by me on the following activities:  Development of treatment plan with patient or surrogate, discussions with consultants, evaluation of patient's response to  treatment, examination of patient, ordering and review of laboratory studies, ordering and review of radiographic studies, ordering and performing treatments and interventions, pulse oximetry, re-evaluation of patient's condition and review of old charts  Medications  doxycycline (VIBRAMYCIN) 100 mg in sodium chloride 0.9 % 250 mL IVPB (100 mg Intravenous New Bag/Given 09/02/21 2040)  morphine (PF) 4 MG/ML injection 4 mg (4 mg Intravenous Given 09/02/21 1628)  ondansetron (ZOFRAN) injection 4 mg (4 mg Intravenous Given 09/02/21 1628)  lactated ringers bolus 1,000 mL (0 mLs Intravenous Stopped 09/02/21 1807)  iohexol (OMNIPAQUE) 300 MG/ML solution 100 mL (100 mLs Intravenous Contrast Given 09/02/21 1636)  cefTRIAXone (ROCEPHIN) 1 g in sodium chloride 0.9 % 100 mL IVPB (0 g Intravenous Stopped 09/02/21 2111)  metroNIDAZOLE (FLAGYL) IVPB 500 mg (0 mg Intravenous Stopped 09/02/21 2142)  ketorolac (TORADOL) 30 MG/ML injection 15 mg (15 mg Intravenous Given 09/02/21 1944)  acetaminophen (TYLENOL) tablet 1,000 mg (1,000 mg Oral Given 09/02/21 1943)  oxyCODONE (Oxy IR/ROXICODONE) immediate release tablet 5 mg (5 mg Oral Given 09/02/21 2035)     IMPRESSION / MDM / ASSESSMENT AND PLAN / ED COURSE  I reviewed the triage vital signs and the nursing notes.  Healthy 21 G1 P1 presents to the ED with abdominal discomfort in the setting of novel vaginal discharge, likely representing PID and ultimately suitable for outpatient management after a fairly long ED observation time.  She is initially tachycardic, and remains hemodynamically stable throughout her stay in the ED.  She has RLQ tenderness with voluntary guarding blood work with leukocytosis to 14, concerning for sepsis in the setting of her tachycardia.  Metabolic panel is reassuring with only slight decrement to her bicarb.  Urine without infectious features and she is not pregnant.  Pelvic exam with cervical motion tenderness and vaginal discharge, sent for wet prep and GC  which all returned negative, just with quite a few WBCs on the wet prep.  CT without evidence of acute appendicitis, and demonstrates a right ovarian cyst.  Follow-up pelvic ultrasound with similar redemonstration of a likely hemorrhagic cyst.  I consult with OB/GYN, who agrees with antibiotics to treat PID and recommends considering outpatient management.  Considering how well she responds to fluid resuscitation, antibiotics as she is being observed, shared decision making with the patient yields plan to pursue outpatient management to follow-up with OB/GYN.  Return precautions for the ED discussed.  Clinical Course as of 09/02/21 2153  Wynelle LinkSun Sep 02, 2021  1622 Pelvic exam performed, well tolerated [DS]  1710 Reassessed and discussed CT results and my recommendation for pelvic u/s. She's agreeable. She's feeling better now [DS]  1849 I chat with Dr. Logan BoresEvans, he agrees that this sounds  like PID, but she may be suitable for outpatient management.  Consider discharge with a couple weeks of Doxy and Flagyl after giving IV antibiotics and fluids if she can tolerate p.o. and has controlled pain. [DS]  1854 Reassessed.  Discussed plan of care.  She is agreeable with trial of outpatient management after IV antibiotics if her symptoms are improving. [DS]  2028 Reassessed [DS]  2153 Reassessed.  Feeling well.  Normal vitals and no tachycardia.  Finishing up her IV antibiotics [DS]    Clinical Course User Index [DS] Delton Prairie, MD     FINAL CLINICAL IMPRESSION(S) / ED DIAGNOSES   Final diagnoses:  RLQ abdominal pain  PID (acute pelvic inflammatory disease)  Cyst of right ovary     Rx / DC Orders   ED Discharge Orders          Ordered    metroNIDAZOLE (FLAGYL) 500 MG tablet  2 times daily        09/02/21 2058    doxycycline (VIBRA-TABS) 100 MG tablet  2 times daily        09/02/21 2058    oxyCODONE (ROXICODONE) 5 MG immediate release tablet  Every 8 hours PRN        09/02/21 2058     ondansetron (ZOFRAN-ODT) 4 MG disintegrating tablet  Every 8 hours PRN        09/02/21 2058             Note:  This document was prepared using Dragon voice recognition software and may include unintentional dictation errors.   Delton Prairie, MD 09/02/21 2157

## 2021-09-02 NOTE — ED Triage Notes (Signed)
Pt via POV from home. Pt nausea for the past week and RLQ abd pain for the past 2 days that has gotten worse today. Denies any fevers or diarrhea. Denies abd surgeries. Pt is A&OX4 and NAD

## 2021-09-02 NOTE — ED Notes (Signed)
RN to bedside to introduce self to pt. Pt ambulated to toilet with no assistance and back to bed.

## 2021-09-03 ENCOUNTER — Emergency Department (HOSPITAL_COMMUNITY)
Admission: EM | Admit: 2021-09-03 | Discharge: 2021-09-03 | Discharge status: Against Medical Advice | Payer: Medicaid Other | Attending: Physician Assistant | Admitting: Physician Assistant

## 2021-09-03 ENCOUNTER — Encounter (HOSPITAL_COMMUNITY): Payer: Self-pay

## 2021-09-03 ENCOUNTER — Telehealth: Payer: Self-pay | Admitting: *Deleted

## 2021-09-03 DIAGNOSIS — Z5321 Procedure and treatment not carried out due to patient leaving prior to being seen by health care provider: Secondary | ICD-10-CM | POA: Insufficient documentation

## 2021-09-03 DIAGNOSIS — R1031 Right lower quadrant pain: Secondary | ICD-10-CM | POA: Insufficient documentation

## 2021-09-03 DIAGNOSIS — N83209 Unspecified ovarian cyst, unspecified side: Secondary | ICD-10-CM | POA: Diagnosis not present

## 2021-09-03 DIAGNOSIS — N9489 Other specified conditions associated with female genital organs and menstrual cycle: Secondary | ICD-10-CM | POA: Insufficient documentation

## 2021-09-03 DIAGNOSIS — R52 Pain, unspecified: Secondary | ICD-10-CM | POA: Diagnosis not present

## 2021-09-03 LAB — CBC WITH DIFFERENTIAL/PLATELET
Abs Immature Granulocytes: 0.04 10*3/uL (ref 0.00–0.07)
Basophils Absolute: 0 10*3/uL (ref 0.0–0.1)
Basophils Relative: 0 %
Eosinophils Absolute: 0.1 10*3/uL (ref 0.0–0.5)
Eosinophils Relative: 1 %
HCT: 39.4 % (ref 36.0–46.0)
Hemoglobin: 12.9 g/dL (ref 12.0–15.0)
Immature Granulocytes: 0 %
Lymphocytes Relative: 26 %
Lymphs Abs: 2.8 10*3/uL (ref 0.7–4.0)
MCH: 27.7 pg (ref 26.0–34.0)
MCHC: 32.7 g/dL (ref 30.0–36.0)
MCV: 84.7 fL (ref 80.0–100.0)
Monocytes Absolute: 0.4 10*3/uL (ref 0.1–1.0)
Monocytes Relative: 3 %
Neutro Abs: 7.5 10*3/uL (ref 1.7–7.7)
Neutrophils Relative %: 70 %
Platelets: 261 10*3/uL (ref 150–400)
RBC: 4.65 MIL/uL (ref 3.87–5.11)
RDW: 14.4 % (ref 11.5–15.5)
WBC: 10.8 10*3/uL — ABNORMAL HIGH (ref 4.0–10.5)
nRBC: 0 % (ref 0.0–0.2)

## 2021-09-03 LAB — COMPREHENSIVE METABOLIC PANEL
ALT: 28 U/L (ref 0–44)
AST: 21 U/L (ref 15–41)
Albumin: 3.7 g/dL (ref 3.5–5.0)
Alkaline Phosphatase: 66 U/L (ref 38–126)
Anion gap: 10 (ref 5–15)
BUN: 6 mg/dL (ref 6–20)
CO2: 22 mmol/L (ref 22–32)
Calcium: 9 mg/dL (ref 8.9–10.3)
Chloride: 109 mmol/L (ref 98–111)
Creatinine, Ser: 0.62 mg/dL (ref 0.44–1.00)
GFR, Estimated: >60 mL/min (ref >60)
Glucose, Bld: 115 mg/dL — ABNORMAL HIGH (ref 70–99)
Potassium: 4 mmol/L (ref 3.5–5.1)
Sodium: 141 mmol/L (ref 135–145)
Total Bilirubin: 0.4 mg/dL (ref 0.3–1.2)
Total Protein: 6.5 g/dL (ref 6.5–8.1)

## 2021-09-03 LAB — I-STAT BETA HCG BLOOD, ED (MC, WL, AP ONLY): I-stat hCG, quantitative: <5 m[IU]/mL (ref <5)

## 2021-09-03 LAB — LACTIC ACID, PLASMA: Lactic Acid, Venous: 1 mmol/L (ref 0.5–1.9)

## 2021-09-03 LAB — LIPASE, BLOOD: Lipase: 25 U/L (ref 11–51)

## 2021-09-03 NOTE — ED Provider Triage Note (Signed)
Emergency Medicine Provider Triage Evaluation Note  Amber [name] , a 21 y.o. female  was evaluated in triage.  Pt complains of pain.  She was seen yesterday at [facility], diagnosed with PID and discharged.  She didn't get the rx filled.  She states that she has been throwing up anything she tries to eat/drink.  She states that she didn't feel well enough to drive to the pharmacy to get her meds this morning.  Denies a cost barrier.    Physical Exam  BP 122/69 (BP Location: Right Arm)    Pulse 95    Temp 98.4 F (36.9 C) (Oral)    Resp 17    Ht 5\' 3"  (1.6 m)    Wt 73 kg    LMP 08/19/2021 (Exact Date)    SpO2 98%    BMI 28.51 kg/m  Gen:   Awake, no distress   Resp:  Normal effort  MSK:   Moves extremities without difficulty  Other:  Normal speech  Medical Decision Making  Medically screening exam initiated at 2:10 PM.  Appropriate orders placed.  Amber [name] was informed that the remainder of the evaluation will be completed by another provider, this initial triage assessment does not replace that evaluation, and the importance of remaining in the ED until their evaluation is complete.     Karna Dupes, Cristina Gong 09/03/21 1413

## 2021-09-03 NOTE — ED Notes (Signed)
Call PT several times 3:00pm and 3:54PM

## 2021-09-03 NOTE — Telephone Encounter (Signed)
Transition Care Management Follow-up Telephone Call Date of discharge and from where: 09/02/2021 [facility] ED How have you been since you were released from the hospital? "Really tired" Any questions or concerns? No  Items Reviewed: Did the pt receive and understand the discharge instructions provided? Yes  Medications obtained and verified? Yes  Other? No  Any new allergies since your discharge? No  Dietary orders reviewed? No Do you have support at home? Yes    Functional Questionnaire: (I = Independent and D = Dependent) ADLs: I  Bathing/Dressing- I  Meal Prep- I  Eating- I  Maintaining continence- I  Transferring/Ambulation- I  Managing Meds- I  Follow up appointments reviewed:  PCP Hospital f/u appt confirmed? No   - PCP not [facility] f/u appt confirmed? No   Are transportation arrangements needed? No  If their condition worsens, is the pt aware to call PCP or go to the Emergency Dept.? Yes Was the patient provided with contact information for the PCP's office or ED? Yes Was to pt encouraged to call back with questions or concerns? Yes

## 2021-09-03 NOTE — ED Triage Notes (Signed)
Pt arrives POV from home for eval of RLQ abd pain Seen yesterday at [facility] dx'd w/ PID and ovarian cyst on R side. Reports continued N/V as well as worsening abd pain today.

## 2021-09-06 ENCOUNTER — Encounter: Payer: Self-pay | Admitting: Emergency Medicine

## 2021-09-06 ENCOUNTER — Other Ambulatory Visit: Payer: Self-pay

## 2021-09-06 ENCOUNTER — Emergency Department
Admission: EM | Admit: 2021-09-06 | Discharge: 2021-09-06 | Disposition: A | Discharge status: Home / Self Care | Payer: Medicaid Other | Attending: Emergency Medicine | Admitting: Emergency Medicine

## 2021-09-06 ENCOUNTER — Emergency Department: Payer: Medicaid Other

## 2021-09-06 DIAGNOSIS — R197 Diarrhea, unspecified: Secondary | ICD-10-CM | POA: Diagnosis not present

## 2021-09-06 DIAGNOSIS — R059 Cough, unspecified: Secondary | ICD-10-CM | POA: Insufficient documentation

## 2021-09-06 DIAGNOSIS — R112 Nausea with vomiting, unspecified: Secondary | ICD-10-CM | POA: Diagnosis present

## 2021-09-06 DIAGNOSIS — N83201 Unspecified ovarian cyst, right side: Secondary | ICD-10-CM | POA: Diagnosis not present

## 2021-09-06 DIAGNOSIS — R110 Nausea: Secondary | ICD-10-CM

## 2021-09-06 DIAGNOSIS — N83202 Unspecified ovarian cyst, left side: Secondary | ICD-10-CM | POA: Diagnosis not present

## 2021-09-06 DIAGNOSIS — E860 Dehydration: Secondary | ICD-10-CM | POA: Diagnosis not present

## 2021-09-06 DIAGNOSIS — R102 Pelvic and perineal pain: Secondary | ICD-10-CM | POA: Diagnosis not present

## 2021-09-06 DIAGNOSIS — R0981 Nasal congestion: Secondary | ICD-10-CM | POA: Diagnosis not present

## 2021-09-06 DIAGNOSIS — R109 Unspecified abdominal pain: Secondary | ICD-10-CM

## 2021-09-06 LAB — URINALYSIS, COMPLETE (UACMP) WITH MICROSCOPIC
Bacteria, UA: NONE SEEN
Bilirubin Urine: NEGATIVE
Glucose, UA: NEGATIVE mg/dL
Hgb urine dipstick: NEGATIVE
Ketones, ur: NEGATIVE mg/dL
Nitrite: NEGATIVE
Protein, ur: NEGATIVE mg/dL
Specific Gravity, Urine: 1.027 (ref 1.005–1.030)
pH: 7 (ref 5.0–8.0)

## 2021-09-06 LAB — BASIC METABOLIC PANEL
Anion gap: 8 (ref 5–15)
BUN: 9 mg/dL (ref 6–20)
CO2: 22 mmol/L (ref 22–32)
Calcium: 8.9 mg/dL (ref 8.9–10.3)
Chloride: 106 mmol/L (ref 98–111)
Creatinine, Ser: 0.46 mg/dL (ref 0.44–1.00)
GFR, Estimated: >60 mL/min (ref >60)
Glucose, Bld: 120 mg/dL — ABNORMAL HIGH (ref 70–99)
Potassium: 3.7 mmol/L (ref 3.5–5.1)
Sodium: 136 mmol/L (ref 135–145)

## 2021-09-06 LAB — HEPATIC FUNCTION PANEL
ALT: 59 U/L — ABNORMAL HIGH (ref 0–44)
AST: 30 U/L (ref 15–41)
Albumin: 4.1 g/dL (ref 3.5–5.0)
Alkaline Phosphatase: 59 U/L (ref 38–126)
Bilirubin, Direct: <0.1 mg/dL (ref 0.0–0.2)
Total Bilirubin: 0.3 mg/dL (ref 0.3–1.2)
Total Protein: 7.3 g/dL (ref 6.5–8.1)

## 2021-09-06 LAB — CBC
HCT: 41.6 % (ref 36.0–46.0)
Hemoglobin: 13.7 g/dL (ref 12.0–15.0)
MCH: 28.2 pg (ref 26.0–34.0)
MCHC: 32.9 g/dL (ref 30.0–36.0)
MCV: 85.6 fL (ref 80.0–100.0)
Platelets: 291 10*3/uL (ref 150–400)
RBC: 4.86 MIL/uL (ref 3.87–5.11)
RDW: 14.5 % (ref 11.5–15.5)
WBC: 9.9 10*3/uL (ref 4.0–10.5)
nRBC: 0 % (ref 0.0–0.2)

## 2021-09-06 LAB — POC URINE PREG, ED: Preg Test, Ur: NEGATIVE

## 2021-09-06 MED ORDER — MORPHINE SULFATE (PF) 4 MG/ML IV SOLN
4.0000 mg | INTRAVENOUS | Status: DC | PRN
Start: 2021-09-06 — End: 2021-09-07
  Administered 2021-09-06: 4 mg via INTRAVENOUS
  Filled 2021-09-06: qty 1

## 2021-09-06 MED ORDER — MORPHINE SULFATE (PF) 4 MG/ML IV SOLN
4.0000 mg | Freq: Once | INTRAVENOUS | Status: AC
  Administered 2021-09-06: 4 mg via INTRAVENOUS
  Filled 2021-09-06: qty 1

## 2021-09-06 MED ORDER — KETOROLAC TROMETHAMINE 30 MG/ML IJ SOLN
15.0000 mg | Freq: Once | INTRAMUSCULAR | Status: AC
  Administered 2021-09-06: 15 mg via INTRAVENOUS
  Filled 2021-09-06: qty 1

## 2021-09-06 MED ORDER — ONDANSETRON HCL 4 MG/2ML IJ SOLN
4.0000 mg | Freq: Once | INTRAMUSCULAR | Status: AC
  Administered 2021-09-06: 4 mg via INTRAVENOUS
  Filled 2021-09-06: qty 2

## 2021-09-06 MED ORDER — LACTATED RINGERS IV BOLUS
1000.0000 mL | Freq: Once | INTRAVENOUS | Status: AC
  Administered 2021-09-06: 1000 mL via INTRAVENOUS

## 2021-09-06 MED ORDER — METOCLOPRAMIDE HCL 5 MG/ML IJ SOLN
10.0000 mg | Freq: Once | INTRAMUSCULAR | Status: AC
  Administered 2021-09-06: 10 mg via INTRAVENOUS
  Filled 2021-09-06: qty 2

## 2021-09-06 MED ORDER — METOCLOPRAMIDE HCL 10 MG PO TABS: 10.0000 mg | ORAL_TABLET | Freq: Three times a day (TID) | ORAL | 0 refills | Status: DC | PRN

## 2021-09-06 NOTE — ED Provider Notes (Signed)
Patient received in signout from Dr. Tamala Julian pending follow-up ultrasound.  Patient been having recurrent right lower quadrant pain after recent diagnosis of hemorrhagic cyst.  Had extensive work-up a few days ago when in the ER had CT imaging that showed normal appendix.  She was covered with antibiotics for PID though her wet prep and cultures have been negative.  States she is having persistent pain.  States that today's pain is not as bad as when she was here first time.  Ultrasound reviewed and does not show any evidence of torsion does have persistent hemorrhagic cyst slightly changed in size no significant amount of free fluid.  On repeat abdominal exam she does not have any guarding or rebound.  She still receiving IV fluids.  We will add on Toradol for additional pain control.  She has follow-up with Dr. Amalia Hailey of OB/GYN this week.  We discussed results not felt to require hospitalization or urgent surgical management at this time particular given her reassuring work-up thus far.  I do believe patient would be appropriate for outpatient follow-up.  Discussed strict return precautions.  Patient agreeable to plan.   Merlyn Lot, MD 09/06/21 [phone]

## 2021-09-06 NOTE — ED Provider Notes (Signed)
[facility] Provider Note    Event Date/Time   First MD Initiated Contact with Patient 09/06/21 1416     (approximate)   History   Emesis   HPI  Amber [name] is a 21 y.o. female without significant past medical history aside from recent diagnosis of a symptomatic possibly ruptured right ovarian cyst and PID on 2/5 when she came to the emergency room discharged on Flagyl, doxycycline and prescription oxycodone and Zofran who presents for assessment of some ongoing pain in her right lower quadrant as well as nausea and diarrhea and some new pain in left lower quadrant.  Patient states the left side started hurting yesterday.  She has not been able to drink much of anything.  She denies any new or worsening vaginal discharge or bleeding or any burning with urination.  She states she does have a little cough and congestion but no fevers, shortness of breath, chest pain, headache, earache, sore throat, back pain rash or extremity pain.  She states he has been taking all of her antibiotics and has had diarrhea started yesterday.  No other acute concerns at this time.      Physical Exam  Triage Vital Signs: ED Triage Vitals  Enc Vitals Group     BP 09/06/21 1222 124/82     Pulse Rate 09/06/21 1222 80     Resp 09/06/21 1222 16     Temp 09/06/21 1222 98.5 F (36.9 C)     Temp Source 09/06/21 1222 Oral     SpO2 09/06/21 1222 97 %     Weight 09/06/21 1221 160 lb 15 oz (73 kg)     Height 09/06/21 1221 5\' 3"  (1.6 m)     Head Circumference --      Peak Flow --      Pain Score 09/06/21 1221 0     Pain Loc --      Pain Edu? --      Excl. in GC? --     Most recent vital signs: Vitals:   09/06/21 1222 09/06/21 1504  BP: 124/82 124/78  Pulse: 80 97  Resp: 16 16  Temp: 98.5 F (36.9 C)   SpO2: 97% 100%    General: Awake, no distress.  CV:  Good peripheral perfusion.  Resp:  Normal effort.  Clear bilaterally. Abd:  No distention.  Mildly tender in the  bilateral lower quadrants. Other:  Dry mucous membranes.   ED Results / Procedures / Treatments  Labs (all labs ordered are listed, but only abnormal results are displayed) Labs Reviewed  BASIC METABOLIC PANEL - Abnormal; Notable for the following components:      Result Value   Glucose, Bld 120 (*)    All other components within normal limits  CBC  URINALYSIS, COMPLETE (UACMP) WITH MICROSCOPIC  HEPATIC FUNCTION PANEL  POC URINE PREG, ED     EKG    RADIOLOGY    PROCEDURES:  Critical Care performed: No  Procedures   MEDICATIONS ORDERED IN ED: Medications  lactated ringers bolus 1,000 mL (1,000 mLs Intravenous New Bag/Given 09/06/21 1459)  ondansetron (ZOFRAN) injection 4 mg (4 mg Intravenous Given 09/06/21 1500)  morphine (PF) 4 MG/ML injection 4 mg (4 mg Intravenous Given 09/06/21 1502)     IMPRESSION / MDM / ASSESSMENT AND PLAN / ED COURSE  I reviewed the triage vital signs and the nursing notes.  Differential diagnosis includes, but is not limited to ongoing symptoms related to a ruptured ovarian cyst, PID, TOA, ovarian torsion, cystitis, gastroenteritis.  Lower suspicion for appendicitis given she states the pain in the right side is not any different than it was on 2/5 and I was able to see the CT done at that time that showed no evidence of appendicitis.  In addition today she has no fever tachycardia hypotension or leukocytosis.  Offered COVID influenza testing for her cough and congestion as she is declining this.  She has no evidence of rest or distress or complaints of shortness of breath or abnormal breath sounds to suggest bacterial pneumonia.   BMP without any significant electrolyte or metabolic derangements DBC without leukocytosis improved from 3 days ago and her WBC count was 10.8.  There is no acute anemia and normal platelets today.  Pregnancy test is negative  Care of patient assumed by oncoming provider at approximately  3:30 PM.  Plan is to follow-up pelvic ultrasound urinalysis hepatic function panel and reassess.  While she is undergoing initial work-up we will give her some IV fluids that she appears slightly dehydrated as well as some analgesia and antiemetic.     FINAL CLINICAL IMPRESSION(S) / ED DIAGNOSES   Final diagnoses:  Pelvic pain  Abdominal pain, unspecified abdominal location  Diarrhea, unspecified type  Nausea     Rx / DC Orders   ED Discharge Orders     None        Note:  This document was prepared using Dragon voice recognition software and may include unintentional dictation errors.   Gilles Chiquito, MD 09/06/21 [phone]

## 2021-09-06 NOTE — ED Notes (Signed)
Pt verbalized understanding of discharge instructions, follow-up care instructions, and prescription medications. Pt advised if symptoms worsen or come back to return to ED.

## 2021-09-06 NOTE — ED Triage Notes (Signed)
Pt via POV from home. Pt was seen at [facility] ED on 2/6 and dx with an ovarian cyst on the R side. States there is no pain. Pt states the vomiting is not getting any better. Pt was prescribed zofran with no relief. Pt is A&OX4 and NAD

## 2021-09-06 NOTE — Discharge Instructions (Signed)

## 2021-09-12 ENCOUNTER — Ambulatory Visit (INDEPENDENT_AMBULATORY_CARE_PROVIDER_SITE_OTHER): Payer: Medicaid Other | Admitting: Obstetrics and Gynecology

## 2021-09-12 ENCOUNTER — Encounter: Payer: Self-pay | Admitting: Obstetrics and Gynecology

## 2021-09-12 ENCOUNTER — Other Ambulatory Visit: Payer: Self-pay

## 2021-09-12 VITALS — BP 109/75 | HR 94 | Ht 63.0 in | Wt 159.0 lb

## 2021-09-12 DIAGNOSIS — N83201 Unspecified ovarian cyst, right side: Secondary | ICD-10-CM | POA: Diagnosis not present

## 2021-09-12 DIAGNOSIS — N739 Female pelvic inflammatory disease, unspecified: Secondary | ICD-10-CM

## 2021-09-12 NOTE — Progress Notes (Signed)
HPI:      [name] is a 21 y.o. G1P0101 who LMP was Patient's last menstrual period was 09/12/2021 (exact date).  Subjective:   She presents today after being seen in the emergency department on 2 occasions in the last week for right-sided pelvic pain.  Ultrasounds performed at that time showed what appears to be a hemorrhagic cyst which has slightly drained by the second ultrasound.  This is also consistent with a decrease in the patient's pelvic pain.  She is no longer taking narcotics and is taking Motrin successfully to control the pain. Of significant note patient takes OCPs and would like to continue.    Hx: The following portions of the patient's history were reviewed and updated as appropriate:             She  has a past medical history of Asthma, PONV (postoperative nausea and vomiting), and Skull fracture ([place]). She does not have any pertinent problems on file. She  has a past surgical history that includes Inner ear surgery (Left). Her family history includes Diabetes in her mother. She  reports that she has never smoked. She has never used smokeless tobacco. She reports that she does not drink alcohol and does not use drugs. She has a current medication list which includes the following prescription(s): diphenhydramine, doxycycline, epinephrine, famotidine, junel fe 1/20, megestrol, metoclopramide, metronidazole, montelukast, ondansetron, oxycodone, omeprazole, ondansetron, and [DISCONTINUED] ferrous sulfate. She has No Known Allergies.       Review of Systems:  Review of Systems  Constitutional: Denied constitutional symptoms, night sweats, recent illness, fatigue, fever, insomnia and weight loss.  Eyes: Denied eye symptoms, eye pain, photophobia, vision change and visual disturbance.  Ears/Nose/Throat/Neck: Denied ear, nose, throat or neck symptoms, hearing loss, nasal discharge, sinus congestion and sore throat.  Cardiovascular: Denied cardiovascular symptoms,  arrhythmia, chest pain/pressure, edema, exercise intolerance, orthopnea and palpitations.  Respiratory: Denied pulmonary symptoms, asthma, pleuritic pain, productive sputum, cough, dyspnea and wheezing.  Gastrointestinal: Denied, gastro-esophageal reflux, melena, nausea and vomiting.  Genitourinary: See HPI for additional information.  Musculoskeletal: Denied musculoskeletal symptoms, stiffness, swelling, muscle weakness and myalgia.  Dermatologic: Denied dermatology symptoms, rash and scar.  Neurologic: Denied neurology symptoms, dizziness, headache, neck pain and syncope.  Psychiatric: Denied psychiatric symptoms, anxiety and depression.  Endocrine: Denied endocrine symptoms including hot flashes and night sweats.   Meds:   Current Outpatient Medications on File Prior to Visit  Medication Sig Dispense Refill   diphenhydrAMINE (BENADRYL) 25 mg capsule Take 25 capsules by mouth every 6 (six) hours as needed.     doxycycline (VIBRA-TABS) 100 MG tablet Take 1 tablet (100 mg total) by mouth 2 (two) times daily for 14 days. 28 tablet 0   EPINEPHrine 0.3 mg/0.3 mL IJ SOAJ injection Inject 0.3 mg into the muscle as needed for anaphylaxis. 1 each 0   famotidine (PEPCID) 40 MG tablet Take 1 tablet by mouth daily.     JUNEL FE 1/20 1-20 MG-MCG tablet Take 1 tablet by mouth daily.     megestrol (MEGACE) 40 MG tablet Take 40 mg by mouth as needed.     metoCLOPramide (REGLAN) 10 MG tablet Take 1 tablet (10 mg total) by mouth every 8 (eight) hours as needed for nausea or vomiting. 10 tablet 0   metroNIDAZOLE (FLAGYL) 500 MG tablet Take 1 tablet (500 mg total) by mouth 2 (two) times daily for 14 days. 28 tablet 0   montelukast (SINGULAIR) 10 MG tablet Take 10 mg  by mouth daily.     ondansetron (ZOFRAN-ODT) 4 MG disintegrating tablet Take 1 tablet (4 mg total) by mouth every 8 (eight) hours as needed. 20 tablet 0   oxyCODONE (ROXICODONE) 5 MG immediate release tablet Take 1 tablet (5 mg total) by mouth  every 8 (eight) hours as needed. 15 tablet 0   omeprazole (PRILOSEC) 20 MG capsule Take 1 capsule (20 mg total) by mouth daily. (Patient not taking: Reported on 09/12/2021) 20 capsule 0   ondansetron (ZOFRAN) 8 MG tablet Take 1 tablet (8 mg total) by mouth every 4 (four) hours as needed for nausea. (Patient not taking: Reported on 09/12/2021) 10 tablet 0   [DISCONTINUED] ferrous sulfate (FERROUSUL) 325 (65 FE) MG tablet Take 1 tablet (325 mg total) by mouth daily with breakfast. (Patient not taking: No sig reported) 30 tablet 11   No current facility-administered medications on file prior to visit.      Objective:     Vitals:   09/12/21 1439  BP: 109/75  Pulse: 94   Filed Weights   09/12/21 1439  Weight: 159 lb (72.1 kg)              Both ultrasounds from the emergency department reviewed.  Discussed in detail with the patient          Assessment:    G1P0101 Patient Active Problem List   Diagnosis Date Noted   Acute blood loss anemia 05/16/2019   Preterm labor 05/15/2019   Preeclampsia 05/15/2019   Pediatric pre-birth visit for expectant parent 04/30/2019     1. Right ovarian cyst     Likely hemorrhagic with drainage of this year between ultrasounds.  Patient much better symptomatically at this time.   Plan:            1.  I have reassured her regarding the noncancerous nature of the cyst.  I expect resolution in the next 6 weeks.  Follow-up ultrasound in the next 6 weeks.  2.  Continue OCPs as patient does not desire pregnancy in the near future. Orders No orders of the defined types were placed in this encounter.   No orders of the defined types were placed in this encounter.     F/U  Return in about 6 weeks (around 10/24/2021) for We will contact her with any abnormal test results. I spent 21 minutes involved in the care of this patient preparing to see the patient by obtaining and reviewing her medical history (including labs, imaging tests and prior  procedures), documenting clinical information in the electronic health record (EHR), counseling and coordinating care plans, writing and sending prescriptions, ordering tests or procedures and in direct communicating with the patient and medical staff discussing pertinent items from her history and physical exam.  Finis Bud, M.D. 09/12/2021 3:20 PM

## 2021-09-12 NOTE — Progress Notes (Signed)
Patient presents today after recent finding of a right sided ovarian cyst. Patient states being in pain on her right side and nauseated. Patient states she started to bleed lightly today, states she [place] unsure whether this is her period or due to the cyst.  Patient states no further questions at this time.

## 2021-09-19 DIAGNOSIS — T783XXD Angioneurotic edema, subsequent encounter: Secondary | ICD-10-CM | POA: Diagnosis not present

## 2021-09-19 DIAGNOSIS — R059 Cough, unspecified: Secondary | ICD-10-CM | POA: Diagnosis not present

## 2021-09-19 DIAGNOSIS — L501 Idiopathic urticaria: Secondary | ICD-10-CM | POA: Diagnosis not present

## 2021-10-24 ENCOUNTER — Other Ambulatory Visit: Payer: Self-pay | Admitting: Obstetrics and Gynecology

## 2021-10-24 ENCOUNTER — Other Ambulatory Visit: Payer: Medicaid Other

## 2021-10-24 DIAGNOSIS — N83201 Unspecified ovarian cyst, right side: Secondary | ICD-10-CM

## 2021-11-14 DIAGNOSIS — J029 Acute pharyngitis, unspecified: Secondary | ICD-10-CM | POA: Diagnosis not present

## 2021-11-28 DIAGNOSIS — R233 Spontaneous ecchymoses: Secondary | ICD-10-CM | POA: Diagnosis not present

## 2021-11-28 DIAGNOSIS — R109 Unspecified abdominal pain: Secondary | ICD-10-CM | POA: Diagnosis not present

## 2021-11-28 DIAGNOSIS — R110 Nausea: Secondary | ICD-10-CM | POA: Diagnosis not present

## 2021-11-28 DIAGNOSIS — K648 Other hemorrhoids: Secondary | ICD-10-CM | POA: Diagnosis not present

## 2022-01-15 DIAGNOSIS — K602 Anal fissure, unspecified: Secondary | ICD-10-CM | POA: Diagnosis not present

## 2022-01-15 DIAGNOSIS — K5901 Slow transit constipation: Secondary | ICD-10-CM | POA: Diagnosis not present

## 2022-01-15 DIAGNOSIS — R1013 Epigastric pain: Secondary | ICD-10-CM | POA: Diagnosis not present

## 2022-01-15 DIAGNOSIS — Z91018 Allergy to other foods: Secondary | ICD-10-CM | POA: Diagnosis not present

## 2022-01-27 IMAGING — CR DG CHEST 2V
2 series · 2 of 2 positions shown · non-contrast
Comparison: None.

CLINICAL DATA: Chest pain beginning at work. Lightheadedness and
shortness of breath.

EXAM:
CHEST - 2 VIEW

[chest pa]
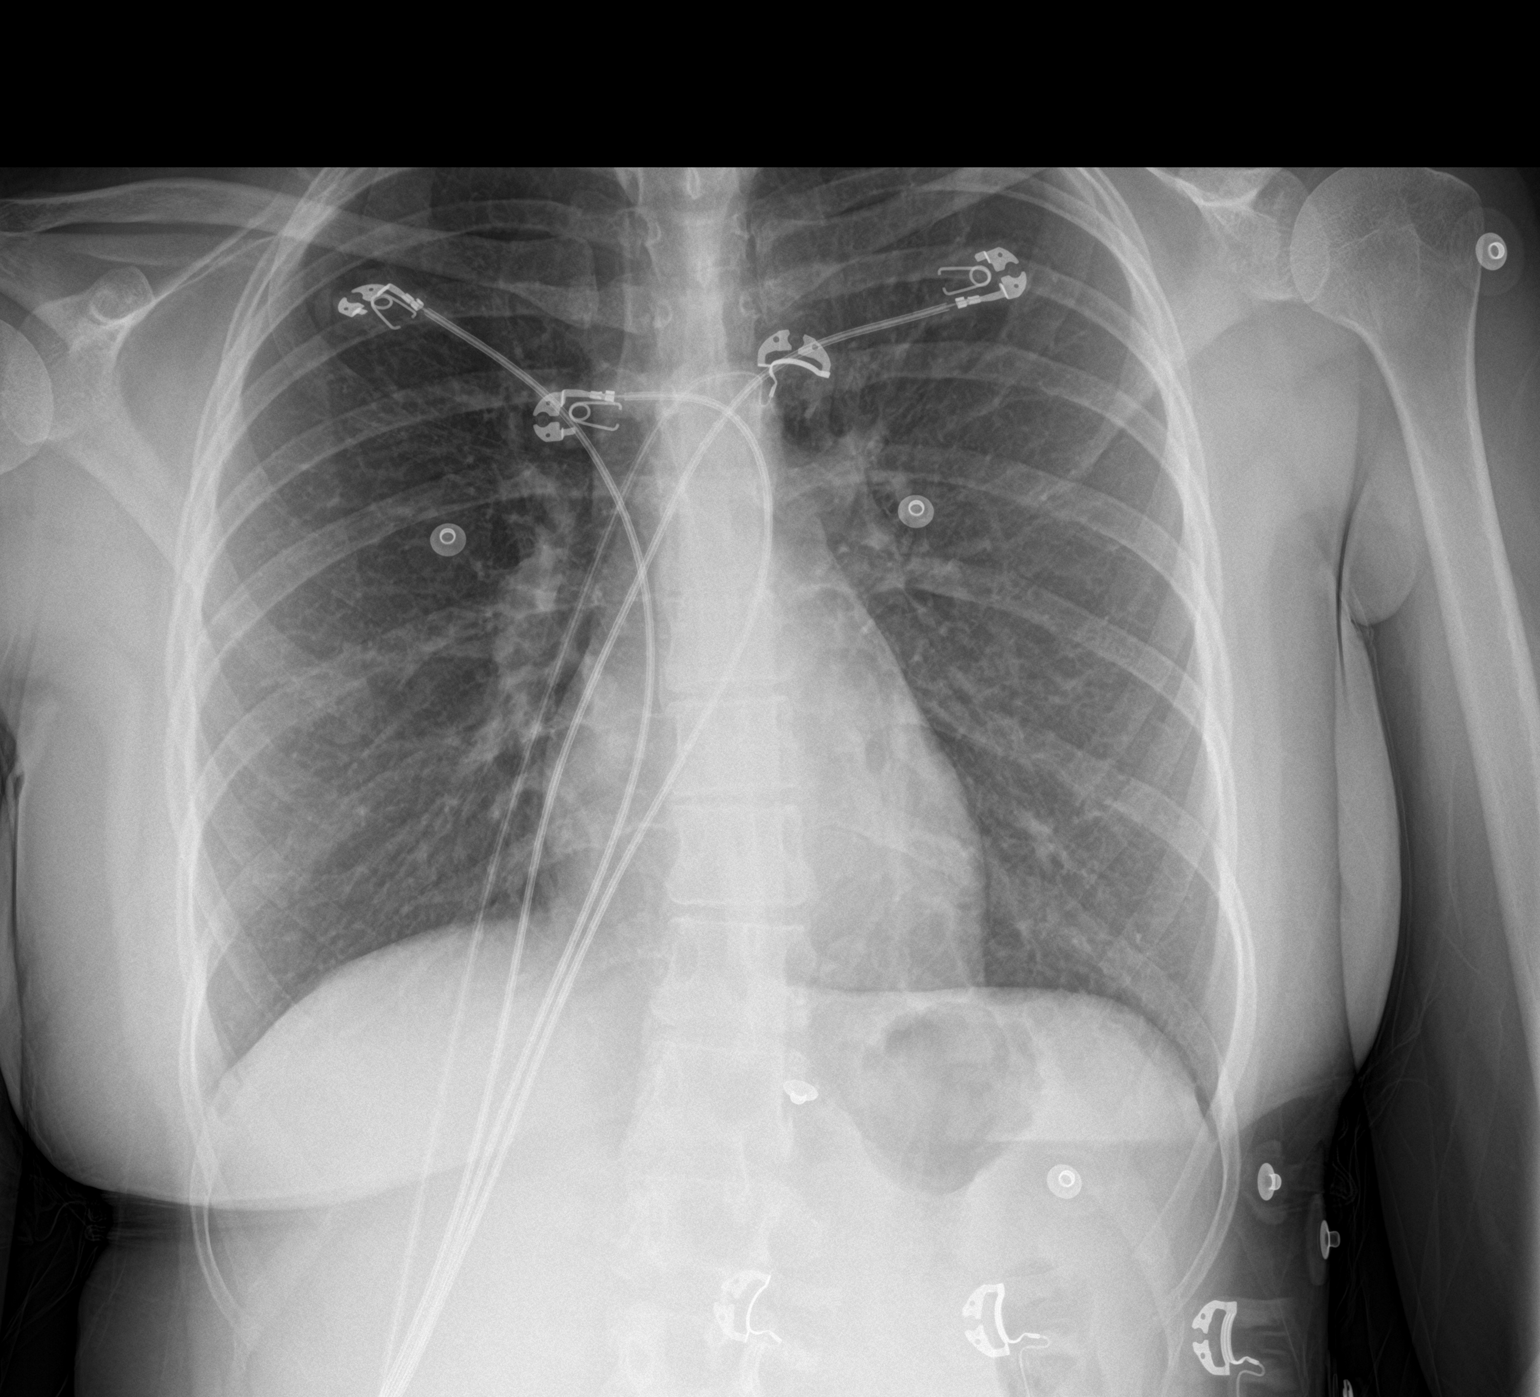

[chest lat]
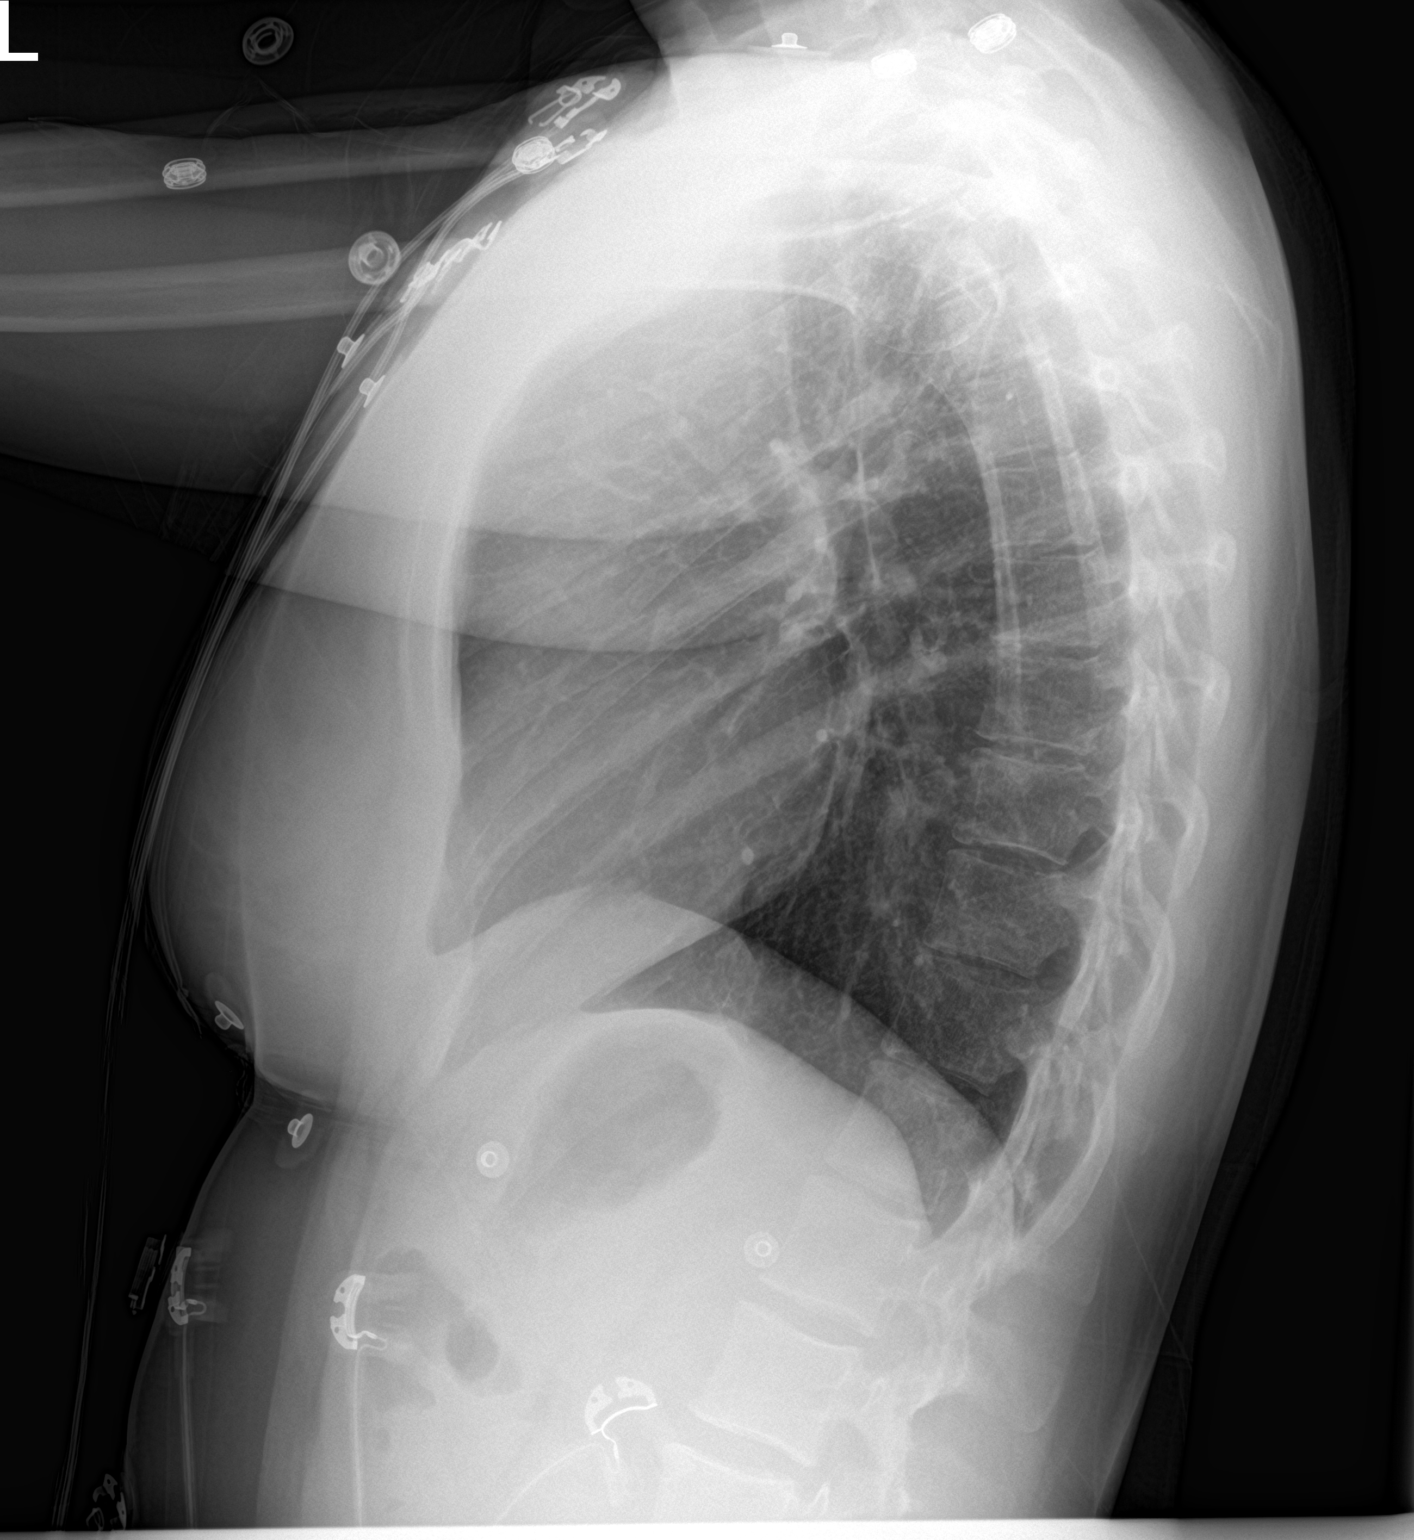

[2 of 2 positions shown; findings below may reference images not displayed]

FINDINGS: Normal heart, mediastinum and hila.

Clear lungs.

No pleural effusion or pneumothorax.

Skeletal structures are unremarkable.
IMPRESSION: Normal chest radiographs.

## 2022-01-28 DIAGNOSIS — K2950 Unspecified chronic gastritis without bleeding: Secondary | ICD-10-CM | POA: Diagnosis not present

## 2022-01-28 DIAGNOSIS — R1013 Epigastric pain: Secondary | ICD-10-CM | POA: Diagnosis not present

## 2022-01-28 DIAGNOSIS — K3189 Other diseases of stomach and duodenum: Secondary | ICD-10-CM | POA: Diagnosis not present

## 2022-03-20 DIAGNOSIS — T783XXD Angioneurotic edema, subsequent encounter: Secondary | ICD-10-CM | POA: Diagnosis not present

## 2022-03-20 DIAGNOSIS — R052 Subacute cough: Secondary | ICD-10-CM | POA: Diagnosis not present

## 2022-03-20 DIAGNOSIS — L501 Idiopathic urticaria: Secondary | ICD-10-CM | POA: Diagnosis not present

## 2022-04-10 DIAGNOSIS — R1013 Epigastric pain: Secondary | ICD-10-CM | POA: Diagnosis not present

## 2022-04-10 DIAGNOSIS — K30 Functional dyspepsia: Secondary | ICD-10-CM | POA: Diagnosis not present

## 2022-05-28 DIAGNOSIS — N760 Acute vaginitis: Secondary | ICD-10-CM | POA: Diagnosis not present

## 2022-07-10 ENCOUNTER — Ambulatory Visit: Payer: Self-pay

## 2022-07-10 ENCOUNTER — Telehealth: Payer: Self-pay | Admitting: Urgent Care

## 2022-07-10 NOTE — Telephone Encounter (Signed)
Left messages for both [facility] and her contact, Britta Mccreedy to inform them that we would need to cancel Amber [name]'s 5pm appointment at Urgent Care today scheduled for a complaint or "arm injury" as we do not have xray capability at this location today. Further informed them of alternatives for seeking care including orthopedic urgent care - [facility] and [facility], as well as the ED.

## 2022-07-30 ENCOUNTER — Ambulatory Visit: Payer: Self-pay

## 2022-07-30 DIAGNOSIS — Z20822 Contact with and (suspected) exposure to covid-19: Secondary | ICD-10-CM | POA: Diagnosis not present

## 2022-07-30 DIAGNOSIS — Z20828 Contact with and (suspected) exposure to other viral communicable diseases: Secondary | ICD-10-CM | POA: Diagnosis not present

## 2022-07-30 DIAGNOSIS — J029 Acute pharyngitis, unspecified: Secondary | ICD-10-CM | POA: Diagnosis not present

## 2022-07-30 DIAGNOSIS — R509 Fever, unspecified: Secondary | ICD-10-CM | POA: Diagnosis not present

## 2022-09-08 ENCOUNTER — Ambulatory Visit: Payer: Self-pay

## 2022-09-08 ENCOUNTER — Ambulatory Visit
Admission: EM | Admit: 2022-09-08 | Discharge: 2022-09-08 | Disposition: A | Discharge status: Home / Self Care | Payer: Medicaid Other

## 2022-09-08 NOTE — ED Notes (Signed)
Attempted to contact patient twice via phone call provided.   No answer after multiple attempts/ sent to voicemail.

## 2022-10-10 ENCOUNTER — Ambulatory Visit
Admission: RE | Admit: 2022-10-10 | Discharge: 2022-10-10 | Disposition: A | Discharge status: Home / Self Care | Payer: Medicaid Other | Source: Ambulatory Visit

## 2022-10-10 VITALS — BP 112/75 | HR 106 | Temp 98.3°F | Resp 18 | Ht 63.0 in | Wt 180.0 lb

## 2022-10-10 DIAGNOSIS — J069 Acute upper respiratory infection, unspecified: Secondary | ICD-10-CM | POA: Insufficient documentation

## 2022-10-10 DIAGNOSIS — J45901 Unspecified asthma with (acute) exacerbation: Secondary | ICD-10-CM | POA: Diagnosis not present

## 2022-10-10 DIAGNOSIS — R059 Cough, unspecified: Secondary | ICD-10-CM | POA: Diagnosis present

## 2022-10-10 DIAGNOSIS — Z1152 Encounter for screening for COVID-19: Secondary | ICD-10-CM | POA: Insufficient documentation

## 2022-10-10 LAB — POCT INFLUENZA A/B
Influenza A, POC: NEGATIVE
Influenza B, POC: NEGATIVE

## 2022-10-10 MED ORDER — PREDNISONE 10 MG PO TABS: 40.0000 mg | ORAL_TABLET | Freq: Every day | ORAL | 0 refills | Status: AC

## 2022-10-10 NOTE — ED Provider Notes (Signed)
Roderic Palau    CSN: [id number] Arrival date & time: 10/10/22  0802      History   Chief Complaint Chief Complaint  Patient presents with   Cough    horrible cough, chest pain, headache, sore throat, stuffy nose, watery eyes, body aches. - Entered by patient    HPI NYEMIAH JOY is a 22 y.o. female.  Patient presents with fever, body aches, headache, congestion, runny nose, cough since 10/08/2022.  Tmax 101.  She reports mild shortness of breath when coughing and has been using her albuterol inhaler.  Treating symptoms with Dayquil, ibuprofen, and Nyquil.  She denies rash, ear pain, sore throat, wheezing, vomiting, diarrhea, or other symptoms.  Her medical history includes asthma.   The history is provided by the patient and medical records.    Past Medical History:  Diagnosis Date   Asthma    PONV (postoperative nausea and vomiting)    Skull fracture ([facility])    at 33 months old    Patient Active Problem List   Diagnosis Date Noted   Acute blood loss anemia 05/16/2019   Preterm labor 05/15/2019   Preeclampsia 05/15/2019   Pediatric pre-birth visit for expectant parent 04/30/2019    Past Surgical History:  Procedure Laterality Date   INNER EAR SURGERY Left     OB History     Gravida  1   Para  1   Term      Preterm  1   AB      Living  1      SAB      IAB      Ectopic      Multiple  0   Live Births  1            Home Medications    Prior to Admission medications   Medication Sig Start Date End Date Taking? Authorizing Provider  albuterol (VENTOLIN HFA) 108 (90 Base) MCG/ACT inhaler 1-2 puff as needed Inhalation q 4-6 hours prn cough/wheeze for 90 days 01/10/21  Yes [provider]  predniSONE (DELTASONE) 10 MG tablet Take 4 tablets (40 mg total) by mouth daily for 5 days. 10/10/22 10/15/22 Yes Sharion Balloon, NP  diphenhydrAMINE (BENADRYL) 25 mg capsule Take 25 capsules by mouth every 6 (six) hours as needed.    [provider]  doxepin (SINEQUAN) 10 MG capsule Take 10 mg by mouth at bedtime.    [provider]  EPINEPHrine 0.3 mg/0.3 mL IJ SOAJ injection Inject 0.3 mg into the muscle as needed for anaphylaxis. 12/02/20   McDonald, Mia A, PA-C  famotidine (PEPCID) 40 MG tablet Take 1 tablet by mouth daily. Patient not taking: Reported on 10/10/2022 08/22/21   [provider]  JUNEL FE 1/20 1-20 MG-MCG tablet Take 1 tablet by mouth daily. Patient not taking: Reported on 10/10/2022 07/16/21   [provider]  megestrol (MEGACE) 40 MG tablet Take 40 mg by mouth as needed. Patient not taking: Reported on 10/10/2022 11/28/20   [provider]  metoCLOPramide (REGLAN) 10 MG tablet Take 1 tablet (10 mg total) by mouth every 8 (eight) hours as needed for nausea or vomiting. Patient not taking: Reported on 10/10/2022 09/06/21 09/06/22  Merlyn Lot, MD  montelukast (SINGULAIR) 10 MG tablet Take 10 mg by mouth daily. 08/27/21   [provider]  omeprazole (PRILOSEC) 20 MG capsule Take 1 capsule (20 mg total) by mouth daily. Patient not taking: Reported on 09/12/2021 10/26/20  Veryl Speak, MD  ondansetron (ZOFRAN) 8 MG tablet Take 1 tablet (8 mg total) by mouth every 4 (four) hours as needed for nausea. Patient not taking: Reported on 09/12/2021 10/26/20   Veryl Speak, MD  ondansetron (ZOFRAN-ODT) 4 MG disintegrating tablet Take 1 tablet (4 mg total) by mouth every 8 (eight) hours as needed. 09/02/21   Vladimir Crofts, MD  ferrous sulfate (FERROUSUL) 325 (65 FE) MG tablet Take 1 tablet (325 mg total) by mouth daily with breakfast. Patient not taking: No sig reported 05/16/19 12/02/20  Jonelle Sidle, MD    Family History Family History  Problem Relation Age of Onset   Diabetes Mother     Social History Social History   Tobacco Use   Smoking status: Never   Smokeless tobacco: Never  Vaping Use   Vaping Use: Never used  Substance Use Topics   Alcohol use: Never    Drug use: Never     Allergies   Patient has no known allergies.   Review of Systems Review of Systems  Constitutional:  Positive for chills and fever.  HENT:  Positive for congestion and rhinorrhea. Negative for ear pain and sore throat.   Respiratory:  Positive for cough and shortness of breath. Negative for wheezing.   Gastrointestinal:  Negative for diarrhea and vomiting.  Skin:  Negative for rash.  All other systems reviewed and are negative.    Physical Exam Triage Vital Signs ED Triage Vitals  Enc Vitals Group     BP      Pulse      Resp      Temp      Temp src      SpO2      Weight      Height      Head Circumference      Peak Flow      Pain Score      Pain Loc      Pain Edu?      Excl. in [place]?    No data found.  Updated Vital Signs BP 112/75   Pulse (!) 106   Temp 98.3 F (36.8 C)   Resp 18   Ht '5\' 3"'$  (1.6 m)   Wt 180 lb (81.6 kg)   LMP 09/14/2022   SpO2 96%   BMI 31.89 kg/m   Visual Acuity Right Eye Distance:   Left Eye Distance:   Bilateral Distance:    Right Eye Near:   Left Eye Near:    Bilateral Near:     Physical Exam Vitals and nursing note reviewed.  Constitutional:      General: She is not in acute distress.    Appearance: Normal appearance. She is well-developed. She is not ill-appearing.  HENT:     Right Ear: Tympanic membrane normal.     Left Ear: Tympanic membrane normal.     Nose: Congestion and rhinorrhea present.     Mouth/Throat:     Mouth: Mucous membranes are moist.     Pharynx: Oropharynx is clear.  Cardiovascular:     Rate and Rhythm: Normal rate and regular rhythm.     Heart sounds: Normal heart sounds.  Pulmonary:     Effort: Pulmonary effort is normal. No respiratory distress.     Breath sounds: Wheezing present.     Comments: Few faint expiratory wheezes. Musculoskeletal:     Cervical back: Neck supple.  Skin:    General: Skin is warm and dry.  Neurological:  Mental Status: She is alert.   Psychiatric:        Mood and Affect: Mood normal.        Behavior: Behavior normal.      UC Treatments / Results  Labs (all labs ordered are listed, but only abnormal results are displayed) Labs Reviewed  SARS CORONAVIRUS 2 (TAT 6-24 HRS)  POCT INFLUENZA A/B    EKG   Radiology No results found.  Procedures Procedures (including critical care time)  Medications Ordered in UC Medications - No data to display  Initial Impression / Assessment and Plan / UC Course  I have reviewed the triage vital signs and the nursing notes.  Pertinent labs & imaging results that were available during my care of the patient were reviewed by me and considered in my medical decision making (see chart for details).    Asthma exacerbation, viral URI.  No respiratory distress, few expiratory wheezes, O2 sat 96% on room air.  Rapid flu negative.  COVID pending.  Treating with prednisone and continued use of albuterol inhaler.  Discussed symptomatic treatment including Tylenol, rest, hydration.  Instructed patient to follow up with her PCP if symptoms are not improving.  She agrees to plan of care.   Final Clinical Impressions(s) / UC Diagnoses   Final diagnoses:  Asthma with acute exacerbation, unspecified asthma severity, unspecified whether persistent  Viral URI     Discharge Instructions      Your flu test is negative.  Your COVID test is pending.    Take the prednisone as directed.  Continue to use your albuterol inhaler as directed.    Take Tylenol as needed for fever or discomfort.  Rest and keep yourself hydrated.    Follow-up with your primary care provider if your symptoms are not improving.         ED Prescriptions     Medication Sig Dispense Auth. Provider   predniSONE (DELTASONE) 10 MG tablet Take 4 tablets (40 mg total) by mouth daily for 5 days. 20 tablet Sharion Balloon, NP      PDMP not reviewed this encounter.   Sharion Balloon, NP 10/10/22 [phone]

## 2022-10-10 NOTE — ED Triage Notes (Signed)
Patient to Urgent Care with complaints of cough/ headaches/ nasal congestion/ watery eyes/ generalized body aches. Describes cough is productive with discolored mucus. Reports fever last night of 101.  Reports symptoms started Tuesday.  Has been taking dayquil/ nyquil/ motrin.

## 2022-10-10 NOTE — Discharge Instructions (Addendum)
Your flu test is negative.  Your COVID test is pending.    Take the prednisone as directed.  Continue to use your albuterol inhaler as directed.    Take Tylenol as needed for fever or discomfort.  Rest and keep yourself hydrated.    Follow-up with your primary care provider if your symptoms are not improving.

## 2022-10-11 LAB — SARS CORONAVIRUS 2 (TAT 6-24 HRS): SARS Coronavirus 2: NEGATIVE

## 2022-11-04 DIAGNOSIS — S61451A Open bite of right hand, initial encounter: Secondary | ICD-10-CM | POA: Diagnosis not present

## 2022-11-04 DIAGNOSIS — W540XXA Bitten by dog, initial encounter: Secondary | ICD-10-CM | POA: Diagnosis not present

## 2023-02-11 DIAGNOSIS — K30 Functional dyspepsia: Secondary | ICD-10-CM | POA: Diagnosis not present

## 2023-04-30 DIAGNOSIS — L501 Idiopathic urticaria: Secondary | ICD-10-CM | POA: Diagnosis not present

## 2023-04-30 DIAGNOSIS — R052 Subacute cough: Secondary | ICD-10-CM | POA: Diagnosis not present

## 2023-05-22 DIAGNOSIS — H918X2 Other specified hearing loss, left ear: Secondary | ICD-10-CM | POA: Diagnosis not present

## 2023-05-22 DIAGNOSIS — S90111A Contusion of right great toe without damage to nail, initial encounter: Secondary | ICD-10-CM | POA: Diagnosis not present

## 2023-06-30 ENCOUNTER — Ambulatory Visit: Payer: Medicaid Other | Attending: Physician Assistant | Admitting: Audiology

## 2023-06-30 DIAGNOSIS — H9012 Conductive hearing loss, unilateral, left ear, with unrestricted hearing on the contralateral side: Secondary | ICD-10-CM | POA: Insufficient documentation

## 2023-06-30 NOTE — Procedures (Signed)
  Outpatient Audiology and [facility] [address] [place], [place]  [address] [phone]  AUDIOLOGICAL  EVALUATION  NAME: Amber [name]     DOB:   2000-12-16      MRN: [id number]                                                                                     DATE: 06/30/2023     REFERENT: [facility] STATUS: Outpatient DIAGNOSIS: conductive hearing loss, left ear   History: [name] was seen for an audiological evaluation due to concerns regarding hearing sensitivity in the left ear. [name] reports a significant history of ear infections as a child and multiple PE tube surgeries. 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 [name] was followed by ENT in [place] and has not seen an ENT in many years. [name] reports recently she has struggled more to hear from her left ear. 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 She reports increased difficulty hearing out of the headset in her left ear. [name] reports intermittent tinnitus. She denies otalgia, aural fullness, and dizziness.   Evaluation:  Otoscopy showed a clear view of the tympanic membrane in the right ear and non-occluding cerumen in the left ear.  Tympanometry results were consistent in the right ear with a hypermobile tympanic membrane and normal middle ear pressure (Type Ad) and consistent in the left ear with no tympanic membrane mobility and a large ear canal volume suggesting a perforated tympanic membrane (Type B)  Audiometric testing was completed using Conventional Audiometry techniques with insert earphones and TDH headphones. Test results are consistent in the right ear with normal hearing sensitivity at [phone] and in the left ear with a conductive hearing loss at [phone] rising to normal hearing sensitivity. Speech Recognition Thresholds were obtained at 15 dB HL in the  right ear and at 25  dB HL in the left ear. Word Recognition Testing was completed at 60 dB HL and [name] scored 100% in both ears.    Results:  The test results were reviewed with [facility]. Today's results from tympanometry show    Recommendations: 1.   ***   *** minutes spent testing and counseling on results.   If you have any questions please feel free to contact me at [phone].  Marton Redwood Audiologist, Au.D., CCC-A 06/30/2023  3:59 PM  Cc: [facility]

## 2023-07-30 NOTE — L&D Delivery Note (Signed)
 DELIVERY NOTE  Pt complete and at +2 station with urge to push. Epidural controlling pain. Pt pushed and delivered a viable female infant in LOA position. Anterior and posterior shoulders spontaneously delivered with next two pushes; body easily followed next. Infant placed on mothers abdomen and bulb suction of mouth and nose performed. Cord was then clamped and cut by MD. Cord blood obtained, 3VC. NO cry and poor resp effort noted, taken to warmer for stimulation. Cord gas obtained. Placenta then delivered at 0441 intact. Fundal massage performed and pitocin  per protocol. Fundus firm. The following lacerations were noted: 2nd deg. Repaired in routine fashion with 2-0 vicryl, EBL 250cc. Meconium-stained fluid followed baby. Mother and baby stable. Counts correct   Infant time: 29 Gender: female, DESIRES CIRC Placenta time: 0441 Apgars: 5/9 Weight: pending skin-to-skin

## 2023-07-31 DIAGNOSIS — Z113 Encounter for screening for infections with a predominantly sexual mode of transmission: Secondary | ICD-10-CM | POA: Diagnosis not present

## 2023-07-31 DIAGNOSIS — Z3201 Encounter for pregnancy test, result positive: Secondary | ICD-10-CM | POA: Diagnosis not present

## 2023-07-31 DIAGNOSIS — Z3480 Encounter for supervision of other normal pregnancy, unspecified trimester: Secondary | ICD-10-CM | POA: Diagnosis not present

## 2023-07-31 DIAGNOSIS — O26841 Uterine size-date discrepancy, first trimester: Secondary | ICD-10-CM | POA: Diagnosis not present

## 2023-07-31 LAB — OB RESULTS CONSOLE GC/CHLAMYDIA
Chlamydia: NEGATIVE
Neisseria Gonorrhea: NEGATIVE

## 2023-08-10 ENCOUNTER — Encounter (HOSPITAL_BASED_OUTPATIENT_CLINIC_OR_DEPARTMENT_OTHER): Payer: Self-pay

## 2023-08-10 ENCOUNTER — Inpatient Hospital Stay (HOSPITAL_COMMUNITY)
Admission: AD | Admit: 2023-08-10 | Discharge: 2023-08-10 | Disposition: A | Discharge status: Home / Self Care | Payer: Medicaid Other | Attending: Obstetrics and Gynecology | Admitting: Obstetrics and Gynecology

## 2023-08-10 ENCOUNTER — Encounter (HOSPITAL_COMMUNITY): Payer: Self-pay | Admitting: Obstetrics and Gynecology

## 2023-08-10 ENCOUNTER — Ambulatory Visit (HOSPITAL_BASED_OUTPATIENT_CLINIC_OR_DEPARTMENT_OTHER)
Admission: RE | Admit: 2023-08-10 | Discharge: 2023-08-10 | Disposition: A | Discharge status: Home / Self Care | Payer: Medicaid Other | Source: Ambulatory Visit | Attending: Family Medicine | Admitting: Family Medicine

## 2023-08-10 VITALS — BP 127/84 | HR 120 | Temp 99.7°F | Resp 18

## 2023-08-10 DIAGNOSIS — R112 Nausea with vomiting, unspecified: Secondary | ICD-10-CM

## 2023-08-10 DIAGNOSIS — R509 Fever, unspecified: Secondary | ICD-10-CM | POA: Diagnosis not present

## 2023-08-10 DIAGNOSIS — O2341 Unspecified infection of urinary tract in pregnancy, first trimester: Secondary | ICD-10-CM | POA: Diagnosis not present

## 2023-08-10 DIAGNOSIS — N390 Urinary tract infection, site not specified: Secondary | ICD-10-CM | POA: Insufficient documentation

## 2023-08-10 DIAGNOSIS — R051 Acute cough: Secondary | ICD-10-CM | POA: Diagnosis not present

## 2023-08-10 DIAGNOSIS — O219 Vomiting of pregnancy, unspecified: Secondary | ICD-10-CM | POA: Diagnosis not present

## 2023-08-10 DIAGNOSIS — J45909 Unspecified asthma, uncomplicated: Secondary | ICD-10-CM | POA: Insufficient documentation

## 2023-08-10 DIAGNOSIS — Z3A09 9 weeks gestation of pregnancy: Secondary | ICD-10-CM | POA: Diagnosis not present

## 2023-08-10 DIAGNOSIS — J101 Influenza due to other identified influenza virus with other respiratory manifestations: Secondary | ICD-10-CM | POA: Insufficient documentation

## 2023-08-10 DIAGNOSIS — O99511 Diseases of the respiratory system complicating pregnancy, first trimester: Secondary | ICD-10-CM | POA: Insufficient documentation

## 2023-08-10 DIAGNOSIS — E860 Dehydration: Secondary | ICD-10-CM

## 2023-08-10 DIAGNOSIS — Z3A10 10 weeks gestation of pregnancy: Secondary | ICD-10-CM

## 2023-08-10 LAB — COMPREHENSIVE METABOLIC PANEL
ALT: 47 U/L — ABNORMAL HIGH (ref 0–44)
AST: 28 U/L (ref 15–41)
Albumin: 3.7 g/dL (ref 3.5–5.0)
Alkaline Phosphatase: 60 U/L (ref 38–126)
Anion gap: 13 (ref 5–15)
BUN: 6 mg/dL (ref 6–20)
CO2: 17 mmol/L — ABNORMAL LOW (ref 22–32)
Calcium: 9.2 mg/dL (ref 8.9–10.3)
Chloride: 104 mmol/L (ref 98–111)
Creatinine, Ser: 0.46 mg/dL (ref 0.44–1.00)
GFR, Estimated: >60 mL/min (ref >60)
Glucose, Bld: 88 mg/dL (ref 70–99)
Potassium: 3.6 mmol/L (ref 3.5–5.1)
Sodium: 134 mmol/L — ABNORMAL LOW (ref 135–145)
Total Bilirubin: 0.6 mg/dL (ref 0.0–1.2)
Total Protein: 6.9 g/dL (ref 6.5–8.1)

## 2023-08-10 LAB — POCT URINALYSIS DIP (MANUAL ENTRY)
Blood, UA: NEGATIVE
Glucose, UA: NEGATIVE mg/dL
Nitrite, UA: NEGATIVE
Protein Ur, POC: 100 mg/dL — AB
Spec Grav, UA: >=1.03 — AB
Urobilinogen, UA: 0.2 U/dL
pH, UA: 6

## 2023-08-10 LAB — URINALYSIS, ROUTINE W REFLEX MICROSCOPIC
Bilirubin Urine: NEGATIVE
Glucose, UA: NEGATIVE mg/dL
Hgb urine dipstick: NEGATIVE
Ketones, ur: 80 mg/dL — AB
Nitrite: NEGATIVE
Protein, ur: 100 mg/dL — AB
Specific Gravity, Urine: 1.031 — ABNORMAL HIGH (ref 1.005–1.030)
WBC, UA: >50 WBC/hpf (ref 0–5)
pH: 5 (ref 5.0–8.0)

## 2023-08-10 LAB — POC COVID19/FLU A&B COMBO
Covid Antigen, POC: NEGATIVE
Influenza A Antigen, POC: POSITIVE — AB
Influenza B Antigen, POC: NEGATIVE

## 2023-08-10 LAB — POCT RAPID STREP A (OFFICE): Rapid Strep A Screen: NEGATIVE

## 2023-08-10 MED ORDER — LACTATED RINGERS IV BOLUS
1000.0000 mL | Freq: Once | INTRAVENOUS | Status: AC
Start: 2023-08-10 — End: 2023-08-10
  Administered 2023-08-10: 1000 mL via INTRAVENOUS

## 2023-08-10 MED ORDER — FAMOTIDINE IN NACL 20-0.9 MG/50ML-% IV SOLN
20.0000 mg | Freq: Once | INTRAVENOUS | Status: AC
  Administered 2023-08-10: 20 mg via INTRAVENOUS
  Filled 2023-08-10: qty 50

## 2023-08-10 MED ORDER — SODIUM CHLORIDE 0.9 % IV SOLN
2.0000 g | Freq: Once | INTRAVENOUS | Status: AC
  Administered 2023-08-10: 2 g via INTRAVENOUS
  Filled 2023-08-10: qty 20

## 2023-08-10 MED ORDER — ACETAMINOPHEN 500 MG PO TABS
1000.0000 mg | ORAL_TABLET | Freq: Once | ORAL | Status: AC
  Administered 2023-08-10: 1000 mg via ORAL
  Filled 2023-08-10: qty 2

## 2023-08-10 MED ORDER — ONDANSETRON HCL 4 MG/2ML IJ SOLN
4.0000 mg | Freq: Once | INTRAMUSCULAR | Status: AC
  Administered 2023-08-10: 4 mg via INTRAVENOUS
  Filled 2023-08-10: qty 2

## 2023-08-10 MED ORDER — ONDANSETRON 4 MG PO TBDP: 4.0000 mg | ORAL_TABLET | Freq: Three times a day (TID) | ORAL | 1 refills | Status: AC | PRN

## 2023-08-10 MED ORDER — ONDANSETRON 4 MG PO TBDP
4.0000 mg | ORAL_TABLET | Freq: Once | ORAL | Status: AC
  Administered 2023-08-10: 4 mg via ORAL

## 2023-08-10 MED ORDER — CEPHALEXIN 500 MG PO CAPS: 500.0000 mg | ORAL_CAPSULE | Freq: Three times a day (TID) | ORAL | 0 refills | Status: AC

## 2023-08-10 MED ORDER — ONDANSETRON HCL 4 MG PO TABS: 4.0000 mg | ORAL_TABLET | Freq: Three times a day (TID) | ORAL | Status: DC | PRN

## 2023-08-10 MED ORDER — CEPHALEXIN 500 MG PO CAPS
500.0000 mg | ORAL_CAPSULE | Freq: Three times a day (TID) | ORAL | 0 refills | Status: DC
Start: 2023-08-10 — End: 2023-08-10

## 2023-08-10 MED ORDER — ONDANSETRON 4 MG PO TBDP: 4.0000 mg | ORAL_TABLET | Freq: Three times a day (TID) | ORAL | 1 refills | Status: DC | PRN

## 2023-08-10 MED ORDER — GLYCOPYRROLATE 0.2 MG/ML IJ SOLN
0.2000 mg | Freq: Once | INTRAMUSCULAR | Status: AC
  Administered 2023-08-10: 0.2 mg via INTRAVENOUS
  Filled 2023-08-10: qty 1

## 2023-08-10 NOTE — ED Triage Notes (Signed)
 Pt c/o nausea, vomiting unable to keep anything down, coughing, headache started yesterday, pt is [redacted] weeks pregnant. Pt c/o lower back pain.

## 2023-08-10 NOTE — ED Provider Notes (Signed)
 [facility]    CSN: [id number] Arrival date & time: 08/10/23  1155      History   Chief Complaint Chief Complaint  Patient presents with   Nausea    Nausea/vomiting,  not being able to hold anything down. headache. body weakness. chest burning sensation. cough. also, i'm pregnant so not being able to hold anything down is hard. - Entered by patient   Cough    HPI Amber [name] is a 23 y.o. female.   Here with her husband and son.  Patient reports that she is [redacted] weeks pregnant.  She has seen her obstetrician once and the pregnancy is progressing well at this point.  She developed acute onset intractable nausea and vomiting yesterday in the morning.  She has not kept any food or fluids down all day yesterday nor today.  She has bodyaches, cough, congestion and is concerned she might have flu or something wrong.  At this point she has not yet felt fetal movement and is only [redacted] weeks pregnant.  No vaginal bleeding.   Cough Associated symptoms: rhinorrhea   Associated symptoms: no chest pain, no chills, no ear pain, no fever, no rash, no shortness of breath and no sore throat     Past Medical History:  Diagnosis Date   Asthma    PONV (postoperative nausea and vomiting)    Skull fracture (HCC)    at 56 months old    Patient Active Problem List   Diagnosis Date Noted   Acute blood loss anemia 05/16/2019   Preterm labor 05/15/2019   Preeclampsia 05/15/2019   Pediatric pre-birth visit for expectant parent 04/30/2019    Past Surgical History:  Procedure Laterality Date   INNER EAR SURGERY Left     OB History     Gravida  2   Para  1   Term      Preterm  1   AB      Living  1      SAB      IAB      Ectopic      Multiple  0   Live Births  1            Home Medications    Prior to Admission medications   Medication Sig Start Date End Date Taking? Authorizing Provider  ondansetron  (ZOFRAN ) 4 MG tablet Take 1 tablet (4 mg total) by  mouth every 8 (eight) hours as needed for nausea or vomiting. 08/10/23  Yes Ival Domino, FNP  albuterol (VENTOLIN HFA) 108 (90 Base) MCG/ACT inhaler 1-2 puff as needed Inhalation q 4-6 hours prn cough/wheeze for 90 days 01/10/21   [provider]  diphenhydrAMINE  (BENADRYL ) 25 mg capsule Take 25 capsules by mouth every 6 (six) hours as needed.    [provider]  doxepin (SINEQUAN) 10 MG capsule Take 10 mg by mouth at bedtime.    [provider]  EPINEPHrine  0.3 mg/0.3 mL IJ SOAJ injection Inject 0.3 mg into the muscle as needed for anaphylaxis. 12/02/20   McDonald, Mia A, PA-C  montelukast (SINGULAIR) 10 MG tablet Take 10 mg by mouth daily. 08/27/21   [provider]  ferrous sulfate  (FERROUSUL) 325 (65 FE) MG tablet Take 1 tablet (325 mg total) by mouth daily with breakfast. Patient not taking: No sig reported 05/16/19 12/02/20  Taam-Akelman, Rosalea K, MD    Family History Family History  Problem Relation Age of Onset   Diabetes Mother  Social History Social History   Tobacco Use   Smoking status: Never   Smokeless tobacco: Never  Vaping Use   Vaping status: Never Used  Substance Use Topics   Alcohol use: Never   Drug use: Never     Allergies   Patient has no known allergies.   Review of Systems Review of Systems  Constitutional:  Negative for chills and fever.  HENT:  Positive for congestion, postnasal drip and rhinorrhea. Negative for ear pain, sinus pressure, sinus pain and sore throat.   Eyes:  Negative for pain and visual disturbance.  Respiratory:  Positive for cough. Negative for shortness of breath.   Cardiovascular:  Negative for chest pain and palpitations.  Gastrointestinal:  Positive for abdominal pain (Mild and she thinks it is related to coughing), nausea and vomiting. Negative for constipation and diarrhea.  Genitourinary:  Negative for dysuria and hematuria.  Musculoskeletal:  Negative for arthralgias and back pain.   Skin:  Negative for color change and rash.  Neurological:  Negative for seizures and syncope.  All other systems reviewed and are negative.    Physical Exam Triage Vital Signs ED Triage Vitals  Encounter Vitals Group     BP 08/10/23 1231 127/84     Systolic BP Percentile --      Diastolic BP Percentile --      Pulse Rate 08/10/23 1231 (!) 120     Resp 08/10/23 1231 18     Temp 08/10/23 1231 99.7 F (37.6 C)     Temp Source 08/10/23 1231 Oral     SpO2 08/10/23 1231 95 %     Weight --      Height --      Head Circumference --      Peak Flow --      Pain Score 08/10/23 1229 7     Pain Loc --      Pain Education --      Exclude from Growth Chart --    No data found.  Updated Vital Signs BP 127/84 (BP Location: Right Arm)   Pulse (!) 120   Temp 99.7 F (37.6 C) (Oral)   Resp 18   LMP  (Exact Date)   SpO2 95%   Visual Acuity Right Eye Distance:   Left Eye Distance:   Bilateral Distance:    Right Eye Near:   Left Eye Near:    Bilateral Near:     Physical Exam   UC Treatments / Results  Labs (all labs ordered are listed, but only abnormal results are displayed) Labs Reviewed  POCT URINALYSIS DIP (MANUAL ENTRY) - Abnormal; Notable for the following components:      Result Value   Bilirubin, UA small (*)    Ketones, POC UA >= (160) (*)    Spec Grav, UA >=1.030 (*)    Protein Ur, POC =100 (*)    Leukocytes, UA Trace (*)    All other components within normal limits  POC COVID19/FLU A&B COMBO - Abnormal; Notable for the following components:   Influenza A Antigen, POC Positive (*)    All other components within normal limits  POCT RAPID STREP A (OFFICE) - Normal    EKG   Radiology No results found.  Procedures Procedures (including critical care time)  Medications Ordered in UC Medications  ondansetron  (ZOFRAN -ODT) disintegrating tablet 4 mg (4 mg Oral Given 08/10/23 1236)    Initial Impression / Assessment and Plan / UC Course  I have reviewed  the  triage vital signs and the nursing notes.  Pertinent labs & imaging results that were available during my care of the patient were reviewed by me and considered in my medical decision making (see chart for details).  Urine shows a high specific gravity of greater than 1.030, ketones, protein.  With her history of nausea and vomiting for 30 hours and not keeping any food or fluid down in these urine results she is most likely clinically dehydrated.  She has influenza type a and may have trouble getting hydrated.  Will refer to MAU unit at [facility] for further workup and treatment and possible IV fluids.  I am not going to provide Tamiflu nor other medications as I am directing her to the MAU for further evaluation and treatment.  Final Clinical Impressions(s) / UC Diagnoses   Final diagnoses:  Acute cough  Fever, unspecified  Pregnancy with 10 completed weeks gestation  Nausea and vomiting, unspecified vomiting type  Type A influenza  Dehydration     Discharge Instructions      Advised that her urinalysis and recent history indicate probable dehydration.  She also has Influenza Type A.  Directed to go to the MAU until at [facility] for further work-up and probable Re-Hydration.     ED Prescriptions     Medication Sig Dispense Auth. Provider   ondansetron  (ZOFRAN ) 4 MG tablet Take 1 tablet (4 mg total) by mouth every 8 (eight) hours as needed for nausea or vomiting. 20 tablet Arietta Eisenstein, FNP      PDMP not reviewed this encounter.   Ival Domino, FNP 08/10/23 1340

## 2023-08-10 NOTE — Discharge Instructions (Signed)
 Advised that her urinalysis and recent history indicate probable dehydration.  She also has Influenza Type A.  Directed to go to the MAU until at [facility] for further work-up and probable Re-Hydration.

## 2023-08-10 NOTE — Discharge Instructions (Signed)

## 2023-08-10 NOTE — MAU Note (Signed)
.  Amber [name] is a 23 y.o. at [redacted]w[redacted]d here in MAU reporting: N/V, lower back pain, body aches, cough, fatigue, and congestion since yesterday. Has not been able to keep down solids or liquids since yesterday. Sent over from Urgent Care for further evaluation. Patient tested positive for Flu A today. UC performed a dipstick which showed ketones and protein. Will send an official UA here in MAU. Denies VB or LOF. PNC with GVOB.  Received 4 mg of Zofran  at UC at 1236. Minimal relief.  Hx PTL with PTD and Pre-E.  Onset of complaint: Yesterday Pain score: 7/10 body aches/lower back  Vitals:   08/10/23 1431  BP: 123/75  Pulse: (!) 118  Resp: 16  Temp: 99.3 F (37.4 C)  SpO2: 100%      FHT: n/a Lab orders placed from triage: UA, Droplet Precaution

## 2023-08-10 NOTE — MAU Provider Note (Signed)
 Chief Complaint: Nausea, Emesis, Flu A, Cough, and Nasal Congestion   Event Date/Time   First Provider Initiated Contact with Patient 08/10/23 1440      SUBJECTIVE HPI: Amber [name] is a 23 y.o. G2P0101 at [redacted]w[redacted]d by LMP who presents to maternity admissions reporting Flu A with N/V.  Patient initially seen at urgent care for N/V, cold symptoms earlier today. She was diagnosed with flu A. She notes she has had some morning sickness this pregnancy but very mild. Has not been able to keep down fluids or solids for the past few days. Initial symptoms Friday with cough. Having a fever on and off, congestion, spitting. Does have a history of asthma but denies wheezing/worsening of asthma symptoms.  Denies cramping, VB, LOF, change in discharge, urinary symptoms.  HPI  Past Medical History:  Diagnosis Date   Asthma    PONV (postoperative nausea and vomiting)    Skull fracture (HCC)    at 67 months old   Past Surgical History:  Procedure Laterality Date   INNER EAR SURGERY Left    Social History   Socioeconomic History   Marital status: Single    Spouse name: Not on file   Number of children: Not on file   Years of education: Not on file   Highest education level: Not on file  Occupational History   Not on file  Tobacco Use   Smoking status: Never   Smokeless tobacco: Never  Vaping Use   Vaping status: Never Used  Substance and Sexual Activity   Alcohol use: Never   Drug use: Never   Sexual activity: Yes    Birth control/protection: Pill  Other Topics Concern   Not on file  Social History Narrative   Not on file   Social Drivers of Health   Financial Resource Strain: Low Risk  (05/22/2023)   Received [facility]   Overall Financial Resource Strain (CARDIA)    Difficulty of Paying Living Expenses: Not hard at all  Food Insecurity: No Food Insecurity (05/22/2023)   Received from [facility]   Hunger Vital Sign    Worried About Running Out of Food in the  Last Year: Never true    Ran Out of Food in the Last Year: Never true  Transportation Needs: No Transportation Needs (05/22/2023)   Received from [facility] - Transportation    Lack of Transportation (Medical): No    Lack of Transportation (Non-Medical): No  Physical Activity: Not on file  Stress: Not on file  Social Connections: Unknown (11/27/2021)   [facility]   Social Network    Social Network: Not on file  Intimate Partner Violence: Unknown (11/01/2021)   Received from [facility], [facility]   HITS    Physically Hurt: Not on file    Insult or Talk Down To: Not on file    Threaten Physical Harm: Not on file    Scream or Curse: Not on file   No current facility-administered medications on file prior to encounter.   Current Outpatient Medications on File Prior to Encounter  Medication Sig Dispense Refill   albuterol (VENTOLIN HFA) 108 (90 Base) MCG/ACT inhaler 1-2 puff as needed Inhalation q 4-6 hours prn cough/wheeze for 90 days     diphenhydrAMINE  (BENADRYL ) 25 mg capsule Take 25 capsules by mouth every 6 (six) hours as needed.     doxepin (SINEQUAN) 10 MG capsule Take 10 mg by mouth at bedtime.  EPINEPHrine  0.3 mg/0.3 mL IJ SOAJ injection Inject 0.3 mg into the muscle as needed for anaphylaxis. 1 each 0   montelukast (SINGULAIR) 10 MG tablet Take 10 mg by mouth daily.     ondansetron  (ZOFRAN ) 4 MG tablet Take 1 tablet (4 mg total) by mouth every 8 (eight) hours as needed for nausea or vomiting. 20 tablet    [DISCONTINUED] ferrous sulfate  (FERROUSUL) 325 (65 FE) MG tablet Take 1 tablet (325 mg total) by mouth daily with breakfast. (Patient not taking: No sig reported) 30 tablet 11   No Known Allergies  ROS:  Pertinent positives/negatives listed above.  I have reviewed patient's Past Medical Hx, Surgical Hx, Family Hx, Social Hx, medications and allergies.   Physical Exam  Patient Vitals for the past 24 hrs:  BP Temp Temp  src Pulse Resp SpO2  08/10/23 1459 121/71 -- -- (!) 110 18 98 %  08/10/23 1431 123/75 99.3 F (37.4 C) Oral (!) 118 16 100 %   Constitutional: Well-developed, well-nourished female. Appears mildly ill HEENT: dry MM, congestion Cardiovascular: tachycardic, no m/r/g Respiratory: normal effort, CTAB GI: Abd soft, non-tender. Pos BS x 4 MS: Extremities nontender, no edema, normal ROM Neurologic: Alert and oriented x 4 GU: Neg CVAT  LAB RESULTS Results for orders placed or performed during the hospital encounter of 08/10/23 (from the past 24 hours)  Urinalysis, Routine w reflex microscopic -Urine, Clean Catch     Status: Abnormal   Collection Time: 08/10/23  2:34 PM  Result Value Ref Range   Color, Urine AMBER (A) YELLOW   APPearance CLOUDY (A) CLEAR   Specific Gravity, Urine 1.031 (H) 1.005 - 1.030   pH 5.0 5.0 - 8.0   Glucose, UA NEGATIVE NEGATIVE mg/dL   Hgb urine dipstick NEGATIVE NEGATIVE   Bilirubin Urine NEGATIVE NEGATIVE   Ketones, ur 80 (A) NEGATIVE mg/dL   Protein, ur 899 (A) NEGATIVE mg/dL   Nitrite NEGATIVE NEGATIVE   Leukocytes,Ua LARGE (A) NEGATIVE   RBC / HPF 6-10 0 - 5 RBC/hpf   WBC, UA >50 0 - 5 WBC/hpf   Bacteria, UA FEW (A) NONE SEEN   Squamous Epithelial / HPF 11-20 0 - 5 /HPF   Mucus PRESENT   Comprehensive metabolic panel     Status: Abnormal   Collection Time: 08/10/23  3:08 PM  Result Value Ref Range   Sodium 134 (L) 135 - 145 mmol/L   Potassium 3.6 3.5 - 5.1 mmol/L   Chloride 104 98 - 111 mmol/L   CO2 17 (L) 22 - 32 mmol/L   Glucose, Bld 88 70 - 99 mg/dL   BUN 6 6 - 20 mg/dL   Creatinine, Ser 9.53 0.44 - 1.00 mg/dL   Calcium 9.2 8.9 - 89.6 mg/dL   Total Protein 6.9 6.5 - 8.1 g/dL   Albumin 3.7 3.5 - 5.0 g/dL   AST 28 15 - 41 U/L   ALT 47 (H) 0 - 44 U/L   Alkaline Phosphatase 60 38 - 126 U/L   Total Bilirubin 0.6 0.0 - 1.2 mg/dL   GFR, Estimated >39 >39 mL/min   Anion gap 13 5 - 15       IMAGING No results found.  MAU  Management/MDM: Orders Placed This Encounter  Procedures   Culture, OB Urine   Comprehensive metabolic panel   Droplet / Contact (Orange) Isolation   Discharge patient    Meds ordered this encounter  Medications   lactated ringers  bolus 1,000 mL   ondansetron  (  ZOFRAN ) injection 4 mg   famotidine  (PEPCID ) IVPB 20 mg premix   glycopyrrolate  (ROBINUL ) injection 0.2 mg   acetaminophen  (TYLENOL ) tablet 1,000 mg   cefTRIAXone  (ROCEPHIN ) 2 g in sodium chloride  0.9 % 100 mL IVPB    Antibiotic Indication::   UTI   DISCONTD: ondansetron  (ZOFRAN -ODT) 4 MG disintegrating tablet    Sig: Take 1 tablet (4 mg total) by mouth every 8 (eight) hours as needed for nausea or vomiting.    Dispense:  30 tablet    Refill:  1   DISCONTD: cephALEXin  (KEFLEX ) 500 MG capsule    Sig: Take 1 capsule (500 mg total) by mouth 3 (three) times daily for 5 days.    Dispense:  15 capsule    Refill:  0   cephALEXin  (KEFLEX ) 500 MG capsule    Sig: Take 1 capsule (500 mg total) by mouth 3 (three) times daily for 5 days.    Dispense:  15 capsule    Refill:  0   ondansetron  (ZOFRAN -ODT) 4 MG disintegrating tablet    Sig: Take 1 tablet (4 mg total) by mouth every 8 (eight) hours as needed for nausea or vomiting.    Dispense:  30 tablet    Refill:  1    Patient presents with flu A and associated symptoms. Markedly dehydrated with >160 ketones in urine. Will obtain e'lytes and give fluids, zofran , pepcid , robinul , tylenol  for symptom relief. Does not have signs of PNA, asthma exacerbation as clear lung sounds and normal O2 sats at this time. Discussed tamiflu; she has had a poor reaction to this in the past and declines.  Patient found to have a UTI as well. Started treatment with CTX 2 g given recent vomiting. Will finish course with keflex .  1633: Patient reassessed. Feels much improved. Sent keflex , zofran  to pharmacy. Still does not have any signs of asthma exacerbation at this time. Return precautions for this,  pneumonia, complications discussed.  ASSESSMENT 1. Influenza A   2. Vomiting pregnancy   3. Urinary tract infection in mother during first trimester of pregnancy   4. [redacted] weeks gestation of pregnancy     PLAN Discharge home with strict return precautions. Allergies as of 08/10/2023   No Known Allergies      Medication List     STOP taking these medications    diphenhydrAMINE  25 mg capsule Commonly known as: BENADRYL    doxepin 10 MG capsule Commonly known as: SINEQUAN       TAKE these medications    albuterol 108 (90 Base) MCG/ACT inhaler Commonly known as: VENTOLIN HFA 1-2 puff as needed Inhalation q 4-6 hours prn cough/wheeze for 90 days   cephALEXin  500 MG capsule Commonly known as: KEFLEX  Take 1 capsule (500 mg total) by mouth 3 (three) times daily for 5 days.   EPINEPHrine  0.3 mg/0.3 mL Soaj injection Commonly known as: EPI-PEN Inject 0.3 mg into the muscle as needed for anaphylaxis.   montelukast 10 MG tablet Commonly known as: SINGULAIR Take 10 mg by mouth daily.   ondansetron  4 MG disintegrating tablet Commonly known as: ZOFRAN -ODT Take 1 tablet (4 mg total) by mouth every 8 (eight) hours as needed for nausea or vomiting.   ondansetron  4 MG tablet Commonly known as: ZOFRAN  Take 1 tablet (4 mg total) by mouth every 8 (eight) hours as needed for nausea or vomiting.         Almarie Moats, MD OB Fellow 08/10/2023  4:33 PM

## 2023-08-11 LAB — CULTURE, OB URINE

## 2023-08-29 DIAGNOSIS — Z3480 Encounter for supervision of other normal pregnancy, unspecified trimester: Secondary | ICD-10-CM | POA: Diagnosis not present

## 2023-08-29 DIAGNOSIS — Z3490 Encounter for supervision of normal pregnancy, unspecified, unspecified trimester: Secondary | ICD-10-CM | POA: Diagnosis not present

## 2023-08-29 DIAGNOSIS — Z3481 Encounter for supervision of other normal pregnancy, first trimester: Secondary | ICD-10-CM | POA: Diagnosis not present

## 2023-08-29 LAB — OB RESULTS CONSOLE RPR: RPR: NONREACTIVE

## 2023-08-29 LAB — HEPATITIS C ANTIBODY: HCV Ab: NEGATIVE

## 2023-08-29 LAB — OB RESULTS CONSOLE RUBELLA ANTIBODY, IGM: Rubella: IMMUNE

## 2023-08-29 LAB — OB RESULTS CONSOLE HEPATITIS B SURFACE ANTIGEN: Hepatitis B Surface Ag: NEGATIVE

## 2023-08-29 LAB — OB RESULTS CONSOLE HIV ANTIBODY (ROUTINE TESTING): HIV: NONREACTIVE

## 2023-08-29 NOTE — Progress Notes (Signed)
 [address] [place] [address] [phone]  Follow-up  Subjective  CC:     Patient presents with  . Abdominal Pain  . Diarrhea  . Emesis    Currently pregnant      HPI:   Amber [name] is a 23 y.o. (DOB [birth year]) female with a history of constipation who presents today for follow up evaluation.  RUQ ultrasound in 2022 was normal.  CT scan in 2023 showed an adnexal cyst but was otherwise negative.  EGD in 01/2022 showed H. pylori negative gastritis.  She has been given doxepin in the past for functional dyspepsia.  She was last seen in clinic in 01/2023 and was doing well at that time.  Today, the patient reports that she is having increased symptoms.  She is currently pregnant and at 12 weeks.  She states that she has been having quite a bit of nausea and vomiting despite Zofran .  She was able to eat small meals but is still having difficulty.  She also reports that she is having mostly diarrhea every 3 days and that otherwise she will have a bowel movement.  She does have abdominal cramping and bloating.  She denies any hematochezia or melena.  History reviewed. No pertinent past medical history. Past Surgical History:  Procedure Laterality Date  . Cranial surgery  2001   repair of fracture? age 23 months-   . Ear drum Right 2017   repaur for perforation  . Upper gastrointestinal endoscopy  01/28/2022    Medications: Outpatient Medications Marked as Taking for the 09/01/23 encounter (Office Visit) with Massie Beagle, PA-C  Medication Sig Dispense Refill  . aspirin (ECOTRIN LOW DOSE) EC tablet Take one tablet (81 mg dose) by mouth daily.    . ondansetron  (ZOFRAN ) 4 mg tablet Take one tablet (4 mg dose) by mouth every 8 (eight) hours as needed.    SABRA PROAIR HFA 108 (90 Base) MCG/ACT inhaler Inhale one puff into the lungs every 4 (four) hours as needed for Shortness of Breath or Wheezing.       Allergies  Allergen Reactions  . Dairy Digestive Hives      Review of Systems:  Except as stated in the HPI, all other systems reviewed and are negative.  Objective  BP 114/76 (BP Location: Left Upper Arm, Patient Position: Sitting)   Pulse 111   Temp 98.3 F (36.8 C) (Oral)   Ht 5' 3 (1.6 m)   Wt 183 lb (83 kg)   BMI 32.42 kg/m    General Appearance:  Alert and oriented, cooperative, no distress, appears stated age  Head:  Normocephalic, without obvious abnormality, atraumatic  Eyes:  Conjunctiva/corneas clear        Neck: Supple, symmetrical, trachea midline  Lungs:   Clear to auscultation bilaterally, respirations unlabored, excursion symmetrical  Breasts:  Deferred  Heart:  Regular rate and rhythm, S1 and S2 normal, no murmur, rub, or gallop  Abdomen:   Soft, non-tender, bowel sounds normoactive, no masses, no organomegaly  Pelvic: Deferred  Extremities: Extremities normal, atraumatic, no cyanosis or edema  Skin: Skin color, texture, turgor normal, no rashes or lesions            Assessment   1. Functional dyspepsia   2. Nausea and vomiting, unspecified vomiting type   3. 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  She had to come off of  doxepin due to her pregnancy.  As for her overall global symptoms we discussed IBgard and she will discuss this medication with her OB/GYN.  She has not had much relief with Zofran  and we will try meclizine for nausea.  Also advised her to start MiraLAX for her bowel movements as her diarrhea is likely related overflow.  Discussion and Plan  Functional dyspepsia Nausea and vomiting, unspecified vomiting type Slow transit constipation - OTC IBgard discussed with the patient - Meclizine 12.5 mg twice daily as needed - 1 capful of MiraLAX daily   No orders of the defined types were placed in this encounter.  Patient's Medications  New Prescriptions   MECLIZINE HCL (ANTIVERT) 12.5 MG  TABLET    Take one tablet (12.5 mg dose) by mouth 2 (two) times daily.  Discontinued Medications   No medications on file   Follow up in about 2 months (around 10/30/2023). Patient Instructions  Here is a summary of what we discussed during today's office visit. If you have any questions or concerns please do not hesitate to contact our office.  - I sent in Meclizine 12.5 mg to help with nausea.  - Start miraLAX 1 capful daily  - Please discuss with your OB/GYN about these medications and IBGard. As we discussed the ingredient list is on the internet  - If your symptoms change/worsen, please let me know     Massie Beagle, PA-C Electronically signed: Massie Beagle, PA-C 09/01/2023 / 11:24 AM

## 2023-09-01 DIAGNOSIS — R112 Nausea with vomiting, unspecified: Secondary | ICD-10-CM | POA: Diagnosis not present

## 2023-09-01 DIAGNOSIS — K5901 Slow transit constipation: Secondary | ICD-10-CM | POA: Diagnosis not present

## 2023-09-01 DIAGNOSIS — K30 Functional dyspepsia: Secondary | ICD-10-CM | POA: Diagnosis not present

## 2023-09-01 NOTE — Progress Notes (Signed)
 AVS printed

## 2023-09-23 DIAGNOSIS — Z3A16 16 weeks gestation of pregnancy: Secondary | ICD-10-CM | POA: Diagnosis not present

## 2023-09-23 DIAGNOSIS — R300 Dysuria: Secondary | ICD-10-CM | POA: Diagnosis not present

## 2023-09-23 DIAGNOSIS — Z8759 Personal history of other complications of pregnancy, childbirth and the puerperium: Secondary | ICD-10-CM | POA: Diagnosis not present

## 2023-09-23 DIAGNOSIS — Z3482 Encounter for supervision of other normal pregnancy, second trimester: Secondary | ICD-10-CM | POA: Diagnosis not present

## 2023-10-16 DIAGNOSIS — Z363 Encounter for antenatal screening for malformations: Secondary | ICD-10-CM | POA: Diagnosis not present

## 2023-10-16 DIAGNOSIS — Z3A19 19 weeks gestation of pregnancy: Secondary | ICD-10-CM | POA: Diagnosis not present

## 2023-12-08 DIAGNOSIS — N760 Acute vaginitis: Secondary | ICD-10-CM | POA: Diagnosis not present

## 2023-12-15 DIAGNOSIS — Z3480 Encounter for supervision of other normal pregnancy, unspecified trimester: Secondary | ICD-10-CM | POA: Diagnosis not present

## 2023-12-25 DIAGNOSIS — O99810 Abnormal glucose complicating pregnancy: Secondary | ICD-10-CM | POA: Diagnosis not present

## 2024-01-28 ENCOUNTER — Inpatient Hospital Stay (HOSPITAL_COMMUNITY)
Admission: AD | Admit: 2024-01-28 | Discharge: 2024-01-28 | Disposition: A | Discharge status: Home / Self Care | Attending: Obstetrics and Gynecology | Admitting: Obstetrics and Gynecology

## 2024-01-28 ENCOUNTER — Encounter (HOSPITAL_COMMUNITY): Payer: Self-pay | Admitting: Obstetrics and Gynecology

## 2024-01-28 DIAGNOSIS — Z3493 Encounter for supervision of normal pregnancy, unspecified, third trimester: Secondary | ICD-10-CM

## 2024-01-28 DIAGNOSIS — O09213 Supervision of pregnancy with history of pre-term labor, third trimester: Secondary | ICD-10-CM | POA: Insufficient documentation

## 2024-01-28 DIAGNOSIS — Z0371 Encounter for suspected problem with amniotic cavity and membrane ruled out: Secondary | ICD-10-CM | POA: Diagnosis present

## 2024-01-28 DIAGNOSIS — O4703 False labor before 37 completed weeks of gestation, third trimester: Secondary | ICD-10-CM | POA: Insufficient documentation

## 2024-01-28 DIAGNOSIS — Z3A34 34 weeks gestation of pregnancy: Secondary | ICD-10-CM

## 2024-01-28 DIAGNOSIS — Z113 Encounter for screening for infections with a predominantly sexual mode of transmission: Secondary | ICD-10-CM | POA: Diagnosis present

## 2024-01-28 DIAGNOSIS — O4700 False labor before 37 completed weeks of gestation, unspecified trimester: Secondary | ICD-10-CM

## 2024-01-28 LAB — URINALYSIS, ROUTINE W REFLEX MICROSCOPIC
Bilirubin Urine: NEGATIVE
Glucose, UA: 50 mg/dL — AB
Ketones, ur: NEGATIVE mg/dL
Nitrite: NEGATIVE
Protein, ur: NEGATIVE mg/dL
Specific Gravity, Urine: 1.012 (ref 1.005–1.030)
pH: 6 (ref 5.0–8.0)

## 2024-01-28 LAB — WET PREP, GENITAL
Clue Cells Wet Prep HPF POC: NONE SEEN
Sperm: NONE SEEN
Trich, Wet Prep: NONE SEEN
WBC, Wet Prep HPF POC: >=10 — AB (ref <10)
Yeast Wet Prep HPF POC: NONE SEEN

## 2024-01-28 LAB — RUPTURE OF MEMBRANE (ROM)PLUS: Rom Plus: NEGATIVE

## 2024-01-28 LAB — POCT FERN TEST: POCT Fern Test: NEGATIVE

## 2024-01-28 MED ORDER — CYCLOBENZAPRINE HCL 10 MG PO TABS
10.0000 mg | ORAL_TABLET | Freq: Two times a day (BID) | ORAL | 0 refills | Status: DC | PRN
Start: 2024-01-28 — End: 2024-02-05

## 2024-01-28 MED ORDER — ACETAMINOPHEN 500 MG PO TABS
1000.0000 mg | ORAL_TABLET | Freq: Once | ORAL | Status: AC
  Administered 2024-01-28: 1000 mg via ORAL
  Filled 2024-01-28: qty 2

## 2024-01-28 MED ORDER — CYCLOBENZAPRINE HCL 5 MG PO TABS
10.0000 mg | ORAL_TABLET | Freq: Once | ORAL | Status: AC
  Administered 2024-01-28: 10 mg via ORAL
  Filled 2024-01-28: qty 2

## 2024-01-28 NOTE — Discharge Instructions (Signed)
 Follow up with your OB provider Increase PO Hydration Tylenol /Flexeril  as prescribed if needed

## 2024-01-28 NOTE — MAU Note (Signed)
..  Amber [name] is a 23 y.o. at [redacted]w[redacted]d here in MAU reporting: lost her mucous plug this morning around 0900, ctx every few minutes. She states they have consistently gotten more frequent and intense. She states she feels like she has been leaking fluid for the last few weeks, but her OB has told her its just a yeast infection. She continues to leak clear watery discharge today. Was 3cm in the office at 1115. Denies VB. +FM.   Pain score: 6 Vitals:   01/28/24 1322  BP: 133/88  Pulse: (!) 115  Resp: 14  Temp: 98.2 F (36.8 C)     FHT:137 Lab orders placed from triage:   UA

## 2024-01-28 NOTE — MAU Provider Note (Signed)
 Chief Complaint:  Contractions and Rupture of Membranes   HPI   Event Date/Time   First Provider Initiated Contact with Patient 01/28/24 1333      Amber [name] is a 23 y.o. G2P0101 at [redacted]w[redacted]d who presents to maternity admissions reporting ctx q 3-4 minutes with some questionable LOF for the last few weeks but was ruled out for ROM by her OB. Denies VB, and reports good FM's. She was seen in OB office today and found to be 3 cm. She reports since her appointment her ctx have increased in intensity.  She does have history of a prior preterm delivery at 35 weeks  Pregnancy Course: [facility]  Past Medical History:  Diagnosis Date   Asthma    PONV (postoperative nausea and vomiting)    Skull fracture (HCC)    at 11 months old   OB History  Gravida Para Term Preterm AB Living  2 1  1  1   SAB IAB Ectopic Multiple Live Births     0 1    # Outcome Date GA Lbr Len/2nd Weight Sex Type Anes PTL Lv  2 Current           1 Preterm 05/14/19 [redacted]w[redacted]d 09:00 / 01:02 2775 g M Vag-Spont EPI  LIV   Past Surgical History:  Procedure Laterality Date   INNER EAR SURGERY Left    Family History  Problem Relation Age of Onset   Diabetes Mother    Social History   Tobacco Use   Smoking status: Never   Smokeless tobacco: Never  Vaping Use   Vaping status: Never Used  Substance Use Topics   Alcohol use: Never   Drug use: Never   No Known Allergies Medications Prior to Admission  Medication Sig Dispense Refill Last Dose/Taking   aspirin EC 81 MG tablet Take 81 mg by mouth daily. Swallow whole.   01/27/2024   Prenatal Vit-Fe Fumarate-FA (MULTIVITAMIN-PRENATAL) 27-0.8 MG TABS tablet Take 1 tablet by mouth daily at 12 noon.   01/27/2024   albuterol (VENTOLIN HFA) 108 (90 Base) MCG/ACT inhaler 1-2 puff as needed Inhalation q 4-6 hours prn cough/wheeze for 90 days      EPINEPHrine  0.3 mg/0.3 mL IJ SOAJ injection Inject 0.3 mg into the muscle as needed for anaphylaxis. 1 each 0    montelukast  (SINGULAIR) 10 MG tablet Take 10 mg by mouth daily.      ondansetron  (ZOFRAN ) 4 MG tablet Take 1 tablet (4 mg total) by mouth every 8 (eight) hours as needed for nausea or vomiting. 20 tablet     ondansetron  (ZOFRAN -ODT) 4 MG disintegrating tablet Take 1 tablet (4 mg total) by mouth every 8 (eight) hours as needed for nausea or vomiting. 30 tablet 1     I have reviewed patient's Past Medical Hx, Surgical Hx, Family Hx, Social Hx, medications and allergies.   ROS  Pertinent items noted in HPI and remainder of comprehensive ROS otherwise negative.   PHYSICAL EXAM  Patient Vitals for the past 24 hrs:  BP Temp Temp src Pulse Resp SpO2 Weight  01/28/24 1322 133/88 98.2 F (36.8 C) Oral (!) 115 14 99 % --  01/28/24 1315 -- -- -- -- -- -- 86.2 kg    Constitutional: Well-developed, well-nourished female in no acute distress but appears uncomfortable with contractions Cardiovascular: normal rate & rhythm, warm and well-perfused Respiratory: normal effort, no problems with respiration noted GI: Abd soft, non-tender, gravid MS: Extremities nontender, no edema, normal ROM  Neurologic: Alert and oriented x 4.  GU: no CVA tenderness Pelvic: Exam chaperoned by Silvano Flippin RN No pooling, mucoid discharge present, cervix visually ~1 cm open, no blood visualized SVE: 3/50/Ballotable - Plan to recheck in 1 hour ( @ 1541 SVE: No cervical Change noted)  Dilation: 3 Effacement (%): 50 Station: Ballotable Exam by:: Olam Dalton, NP  Fetal Tracing: Cat 1 reactive Baseline: 130 Variability: moderate  Accelerations: present Decelerations: absent Toco: ctx q 3-4    Labs: Results for orders placed or performed during the hospital encounter of 01/28/24 (from the past 24 hours)  Urinalysis, Routine w reflex microscopic -Urine, Clean Catch     Status: Abnormal   Collection Time: 01/28/24  1:35 PM  Result Value Ref Range   Color, Urine YELLOW YELLOW   APPearance HAZY (A) CLEAR   Specific Gravity,  Urine 1.012 1.005 - 1.030   pH 6.0 5.0 - 8.0   Glucose, UA 50 (A) NEGATIVE mg/dL   Hgb urine dipstick MODERATE (A) NEGATIVE   Bilirubin Urine NEGATIVE NEGATIVE   Ketones, ur NEGATIVE NEGATIVE mg/dL   Protein, ur NEGATIVE NEGATIVE mg/dL   Nitrite NEGATIVE NEGATIVE   Leukocytes,Ua LARGE (A) NEGATIVE   RBC / HPF 0-5 0 - 5 RBC/hpf   WBC, UA 11-20 0 - 5 WBC/hpf   Bacteria, UA RARE (A) NONE SEEN   Squamous Epithelial / HPF 0-5 0 - 5 /HPF   Mucus PRESENT   Wet prep, genital     Status: Abnormal   Collection Time: 01/28/24  1:59 PM   Specimen: Vaginal/Rectal  Result Value Ref Range   Yeast Wet Prep HPF POC NONE SEEN NONE SEEN   Trich, Wet Prep NONE SEEN NONE SEEN   Clue Cells Wet Prep HPF POC NONE SEEN NONE SEEN   WBC, Wet Prep HPF POC >=10 (A) <10   Sperm NONE SEEN   Rupture of Membrane (ROM) Plus     Status: None   Collection Time: 01/28/24  1:59 PM  Result Value Ref Range   Rom Plus NEGATIVE   Fern Test     Status: None   Collection Time: 01/28/24  2:11 PM  Result Value Ref Range   POCT Fern Test Negative = intact amniotic membranes     Imaging:  No results found.  MDM & MAU COURSE  MDM:  HIGH  Preterm Labor evaluatiion  Prenatal records reviewed Physical exam with pelvic Vaginal swabs: negative , GC pending at discharge Fern : negative ROM Plus : negative GBS swab obtained- pending NST for GA and fetal well being Tylenol  1 gram with Flexeril  10 mg PO for pain relief ordered ( Relief noted from Tylenol /Flexeril ) No cervical change noted after observation x 1.5 hours 3/50/Ballotable  FHRT : Cat 1 reactive and patient reports contractions have spaced out   - No evidence of PTL/PPROM at this time based on no cervical change and ROM Plus Negative Plan to discharge home with precautions, Tylenol /Flexeril  prn, and increased PO Hydration   MAU Course: Orders Placed This Encounter  Procedures   Wet prep, genital   Culture, beta strep (group b only)   Urinalysis,  Routine w reflex microscopic -Urine, Clean Catch   Rupture of Membrane (ROM) Plus   Fern Test   Discharge patient Discharge disposition: 01-Home or Self Care; Discharge patient date: 01/28/2024   Meds ordered this encounter  Medications   acetaminophen  (TYLENOL ) tablet 1,000 mg   cyclobenzaprine  (FLEXERIL ) tablet 10 mg   cyclobenzaprine  (FLEXERIL ) 10 MG  tablet    Sig: Take 1 tablet (10 mg total) by mouth 2 (two) times daily as needed for muscle spasms.    Dispense:  20 tablet    Refill:  0    Supervising Provider:   PRATT, TANYA S [[id number]]     I have reviewed the patient chart and performed the physical exam . I have ordered & interpreted the lab results and reviewed and interpreted the NST Medications ordered as stated below.  A/P as described below.  Counseling and education provided and patient agreeable  with plan as described below. Verbalized understanding.    ASSESSMENT   1. Preterm uterine contractions   2. Intact amniotic membranes during pregnancy in third trimester   3. [redacted] weeks gestation of pregnancy     PLAN  Discharge home in stable condition with return precautions.   See AVS for full description of information given to the patient including both verbal and written. Patient verbalized understanding and agrees with the plan as described above.      Allergies as of 01/28/2024   No Known Allergies      Medication List     TAKE these medications    albuterol 108 (90 Base) MCG/ACT inhaler Commonly known as: VENTOLIN HFA 1-2 puff as needed Inhalation q 4-6 hours prn cough/wheeze for 90 days   aspirin EC 81 MG tablet Take 81 mg by mouth daily. Swallow whole.   cyclobenzaprine  10 MG tablet Commonly known as: FLEXERIL  Take 1 tablet (10 mg total) by mouth 2 (two) times daily as needed for muscle spasms.   EPINEPHrine  0.3 mg/0.3 mL Soaj injection Commonly known as: EPI-PEN Inject 0.3 mg into the muscle as needed for anaphylaxis.   montelukast 10 MG  tablet Commonly known as: SINGULAIR Take 10 mg by mouth daily.   multivitamin-prenatal 27-0.8 MG Tabs tablet Take 1 tablet by mouth daily at 12 noon.   ondansetron  4 MG disintegrating tablet Commonly known as: ZOFRAN -ODT Take 1 tablet (4 mg total) by mouth every 8 (eight) hours as needed for nausea or vomiting.   ondansetron  4 MG tablet Commonly known as: ZOFRAN  Take 1 tablet (4 mg total) by mouth every 8 (eight) hours as needed for nausea or vomiting.        Olam Dalton, MSN, [facility], [facility] [employer]

## 2024-01-29 LAB — GC/CHLAMYDIA PROBE AMP (~~LOC~~) NOT AT ARMC
Chlamydia: NEGATIVE
Comment: NEGATIVE
Comment: NORMAL
Neisseria Gonorrhea: NEGATIVE

## 2024-01-30 LAB — CULTURE, BETA STREP (GROUP B ONLY)

## 2024-01-31 ENCOUNTER — Encounter: Payer: Self-pay | Admitting: Advanced Practice Midwife

## 2024-01-31 ENCOUNTER — Ambulatory Visit: Payer: Self-pay | Admitting: Nurse Practitioner

## 2024-01-31 DIAGNOSIS — O99820 Streptococcus B carrier state complicating pregnancy: Secondary | ICD-10-CM | POA: Insufficient documentation

## 2024-02-01 ENCOUNTER — Inpatient Hospital Stay (HOSPITAL_COMMUNITY)
Admission: AD | Admit: 2024-02-01 | Discharge: 2024-02-02 | Disposition: A | Discharge status: Home / Self Care | Attending: Obstetrics | Admitting: Obstetrics

## 2024-02-01 DIAGNOSIS — Z3A34 34 weeks gestation of pregnancy: Secondary | ICD-10-CM | POA: Insufficient documentation

## 2024-02-01 DIAGNOSIS — O4703 False labor before 37 completed weeks of gestation, third trimester: Secondary | ICD-10-CM | POA: Insufficient documentation

## 2024-02-01 DIAGNOSIS — O479 False labor, unspecified: Secondary | ICD-10-CM

## 2024-02-01 NOTE — MAU Note (Signed)
 MAU Triage Note:  .[name] is a 23 y.o. at [redacted]w[redacted]d here in MAU reporting: reporting contractions that started 2 hours ago that have been every 3 minutes. She reports that the contractions are uncomfortable, but not super painful. Has been having spotting and mucus discharge since she was last seen. Was 3/50 on 7/2. Denies watery LOF. Reports +FM.   Patient complaint: ctx  Pain Score: 5  Pain Location: Abdomen     Onset of complaint: today LMP: Patient's last menstrual period was 09/14/2022.  FHT:   Bypass triage, pt taken directly to room Lab orders placed from triage: N/A

## 2024-02-02 ENCOUNTER — Encounter (HOSPITAL_COMMUNITY): Payer: Self-pay | Admitting: Obstetrics

## 2024-02-02 DIAGNOSIS — O479 False labor, unspecified: Secondary | ICD-10-CM | POA: Diagnosis not present

## 2024-02-02 DIAGNOSIS — Z3A34 34 weeks gestation of pregnancy: Secondary | ICD-10-CM

## 2024-02-02 DIAGNOSIS — O4703 False labor before 37 completed weeks of gestation, third trimester: Secondary | ICD-10-CM | POA: Diagnosis present

## 2024-02-02 MED ORDER — LACTATED RINGERS IV BOLUS
1000.0000 mL | Freq: Once | INTRAVENOUS | Status: AC
  Administered 2024-02-02: 1000 mL via INTRAVENOUS

## 2024-02-02 NOTE — Discharge Instructions (Signed)
 It was a pleasure taking care of you tonight.  I think your contractions are related to your hydration.  Please try to increase how much water you are drinking each day.  If your contractions worsen, you have any leaking of fluid, you feel like your baby is not moving appropriately please return for further evaluation.  Hope you have a great rest of your night!

## 2024-02-02 NOTE — MAU Provider Note (Signed)
 History     CSN: [id number]  Arrival date and time: 02/01/24 2348   Event Date/Time   First Provider Initiated Contact with Patient 02/02/24 0014      Chief Complaint  Patient presents with   Contractions   HPI Patient is a 23 year old G2P11 at 34 weeks 6 days presenting for contractions every 2 to 3 minutes.  Denies any leaking of fluid.  Reports that her mucous plug came out on Thursday of last week and she has been spotting since then.  Reports good fetal movement.  At last check was 3 cm dilated.  Denies any other symptoms. OB History     Gravida  2   Para  1   Term      Preterm  1   AB      Living  1      SAB      IAB      Ectopic      Multiple  0   Live Births  1           Past Medical History:  Diagnosis Date   Asthma    PONV (postoperative nausea and vomiting)    Skull fracture (HCC)    at 45 months old    Past Surgical History:  Procedure Laterality Date   INNER EAR SURGERY Left     Family History  Problem Relation Age of Onset   Diabetes Mother     Social History   Tobacco Use   Smoking status: Never   Smokeless tobacco: Never  Vaping Use   Vaping status: Never Used  Substance Use Topics   Alcohol use: Never   Drug use: Never    Allergies: No Known Allergies  Medications Prior to Admission  Medication Sig Dispense Refill Last Dose/Taking   aspirin EC 81 MG tablet Take 81 mg by mouth daily. Swallow whole.   02/01/2024   Prenatal Vit-Fe Fumarate-FA (MULTIVITAMIN-PRENATAL) 27-0.8 MG TABS tablet Take 1 tablet by mouth daily at 12 noon.   02/01/2024   albuterol (VENTOLIN HFA) 108 (90 Base) MCG/ACT inhaler 1-2 puff as needed Inhalation q 4-6 hours prn cough/wheeze for 90 days      cyclobenzaprine  (FLEXERIL ) 10 MG tablet Take 1 tablet (10 mg total) by mouth 2 (two) times daily as needed for muscle spasms. 20 tablet 0    EPINEPHrine  0.3 mg/0.3 mL IJ SOAJ injection Inject 0.3 mg into the muscle as needed for anaphylaxis. 1 each 0     montelukast (SINGULAIR) 10 MG tablet Take 10 mg by mouth daily.      ondansetron  (ZOFRAN ) 4 MG tablet Take 1 tablet (4 mg total) by mouth every 8 (eight) hours as needed for nausea or vomiting. 20 tablet     ondansetron  (ZOFRAN -ODT) 4 MG disintegrating tablet Take 1 tablet (4 mg total) by mouth every 8 (eight) hours as needed for nausea or vomiting. 30 tablet 1     Review of Systems  Constitutional:  Negative for chills and fever.  Gastrointestinal:  Positive for abdominal pain.  Genitourinary:  Negative for vaginal bleeding and vaginal discharge.   Physical Exam   Height 5' 3 (1.6 m), weight 92.8 kg, last menstrual period 09/14/2022.  Physical Exam Vitals and nursing note reviewed.  Constitutional:      Appearance: Normal appearance.  HENT:     Head: Normocephalic and atraumatic.     Nose: No congestion or rhinorrhea.  Eyes:     Extraocular Movements: Extraocular movements intact.  Cardiovascular:     Rate and Rhythm: Normal rate.  Pulmonary:     Effort: Pulmonary effort is normal.  Abdominal:     Palpations: Abdomen is soft.     Tenderness: There is no abdominal tenderness.  Genitourinary:    Comments: 3/80/-2 Musculoskeletal:        General: Normal range of motion.     Cervical back: Normal range of motion.  Skin:    General: Skin is warm.     Capillary Refill: Capillary refill takes less than 2 seconds.  Neurological:     General: No focal deficit present.     Mental Status: She is alert.     Cranial Nerves: No cranial nerve deficit.  Psychiatric:        Mood and Affect: Mood normal.        Behavior: Behavior normal.     MAU Course  Procedures  MDM SVE NST LR fluid bolus   Assessment and Plan  Amber [name] is a 23 yp G2P0101 @34  weeks 6 days presenting for contractions every 3 minutes.  Preterm labor Patient presenting with contractions every 1 to 2 minutes when hooked up to the monitor.  These are painful contractions that the patient is  breathing through.  SVE 3/80/-2.  Patient given a liter of fluids and her contractions spaced out to every 3 to 5 minutes.  She reports that even when she contracts they are not extremely painful and she does not feel the pressure that she felt prior.  Discussed observation versus discharge home with the patient and patient would like to go home.  Spoke with on-call provider who is agreeable to this plan.  Discussed strict return precautions.  Patient discharged home.  Jc Veron V Elton Heid 02/02/2024, 12:22 AM

## 2024-02-05 ENCOUNTER — Inpatient Hospital Stay (HOSPITAL_COMMUNITY)
Admission: AD | Admit: 2024-02-05 | Discharge: 2024-02-05 | Disposition: A | Discharge status: Home / Self Care | Attending: Obstetrics and Gynecology | Admitting: Obstetrics and Gynecology

## 2024-02-05 ENCOUNTER — Encounter (HOSPITAL_COMMUNITY): Payer: Self-pay | Admitting: Obstetrics and Gynecology

## 2024-02-05 DIAGNOSIS — Z3A35 35 weeks gestation of pregnancy: Secondary | ICD-10-CM | POA: Insufficient documentation

## 2024-02-05 DIAGNOSIS — O09213 Supervision of pregnancy with history of pre-term labor, third trimester: Secondary | ICD-10-CM | POA: Insufficient documentation

## 2024-02-05 DIAGNOSIS — O26893 Other specified pregnancy related conditions, third trimester: Secondary | ICD-10-CM | POA: Diagnosis present

## 2024-02-05 DIAGNOSIS — O219 Vomiting of pregnancy, unspecified: Secondary | ICD-10-CM

## 2024-02-05 DIAGNOSIS — R519 Headache, unspecified: Secondary | ICD-10-CM | POA: Diagnosis not present

## 2024-02-05 DIAGNOSIS — R102 Pelvic and perineal pain: Secondary | ICD-10-CM | POA: Diagnosis not present

## 2024-02-05 DIAGNOSIS — O4703 False labor before 37 completed weeks of gestation, third trimester: Secondary | ICD-10-CM | POA: Diagnosis not present

## 2024-02-05 DIAGNOSIS — O212 Late vomiting of pregnancy: Secondary | ICD-10-CM | POA: Diagnosis not present

## 2024-02-05 DIAGNOSIS — O09893 Supervision of other high risk pregnancies, third trimester: Secondary | ICD-10-CM

## 2024-02-05 MED ORDER — SODIUM CHLORIDE 0.9 % IV SOLN
8.0000 mg | Freq: Once | INTRAVENOUS | Status: AC
  Administered 2024-02-05: 8 mg via INTRAVENOUS
  Filled 2024-02-05: qty 4

## 2024-02-05 MED ORDER — CYCLOBENZAPRINE HCL 10 MG PO TABS: 10.0000 mg | ORAL_TABLET | Freq: Two times a day (BID) | ORAL | 0 refills | Status: DC | PRN

## 2024-02-05 MED ORDER — CYCLOBENZAPRINE HCL 10 MG PO TABS: 10.0000 mg | ORAL_TABLET | Freq: Two times a day (BID) | ORAL | 0 refills | Status: AC | PRN

## 2024-02-05 MED ORDER — LACTATED RINGERS IV BOLUS
1000.0000 mL | Freq: Once | INTRAVENOUS | Status: AC
  Administered 2024-02-05: 1000 mL via INTRAVENOUS

## 2024-02-05 MED ORDER — ACETAMINOPHEN-CAFFEINE 500-65 MG PO TABS
2.0000 | ORAL_TABLET | Freq: Once | ORAL | Status: AC
  Administered 2024-02-05: 2 via ORAL
  Filled 2024-02-05: qty 2

## 2024-02-05 NOTE — Discharge Instructions (Signed)
 2/3-1-1 Rule Go to MAU for painful contractions every 2-3 minutes, lasting 1 minute each for 1.5 hours.

## 2024-02-05 NOTE — MAU Note (Signed)
 Amber [name] is a 23 y.o. at [redacted]w[redacted]d here in MAU reporting: has been having ctx for 3-4 hours . C/O increased pelvic pressure  and pain. Woke up feeling nauseated and with a headache. Took tylenol  without relief. Denies any vag bleeding or leaking. Good fetal movement reported.   LMP:  Onset of complaint: today Pain score: 7-8 Vitals:   02/05/24 1502  BP: 125/85  Pulse: (!) 107  Resp: 18  Temp: 98.6 F (37 C)     FHT: 154  Lab orders placed from triage: u/a

## 2024-02-05 NOTE — MAU Provider Note (Signed)
 History     CSN: [id number]  Arrival date and time: 02/05/24 1447   Event Date/Time   First Provider Initiated Contact with Patient 02/05/24 1535      Chief Complaint  Patient presents with   Contractions   HPI [name] is a 23 y.o. year old G42P0101 female at [redacted]w[redacted]d weeks gestation who presents to MAU reporting contractions every 3-4 hours, increased pelvic pressure and pain; rated 7-8/10. She reports that pressure and pain has been for 1 week, but worse today. She also reports some nausea with a H/A. She took Tylenol  without relief at 0900. She has not taken any medication for nausea. She denies VB of LOF. She reports (+) FM. She receives [facility] with [facility]; next appt is 02/10/2024.    OB History     Gravida  2   Para  1   Term      Preterm  1   AB      Living  1      SAB      IAB      Ectopic      Multiple  0   Live Births  1           Past Medical History:  Diagnosis Date   Asthma    PONV (postoperative nausea and vomiting)    Skull fracture (HCC)    at 62 months old    Past Surgical History:  Procedure Laterality Date   INNER EAR SURGERY Left     Family History  Problem Relation Age of Onset   Diabetes Mother     Social History   Tobacco Use   Smoking status: Never   Smokeless tobacco: Never  Vaping Use   Vaping status: Never Used  Substance Use Topics   Alcohol use: Never   Drug use: Never    Allergies: No Known Allergies  Medications Prior to Admission  Medication Sig Dispense Refill Last Dose/Taking   aspirin EC 81 MG tablet Take 81 mg by mouth daily. Swallow whole.   02/04/2024 Evening   Prenatal Vit-Fe Fumarate-FA (MULTIVITAMIN-PRENATAL) 27-0.8 MG TABS tablet Take 1 tablet by mouth daily at 12 noon.   02/04/2024 Evening   albuterol (VENTOLIN HFA) 108 (90 Base) MCG/ACT inhaler 1-2 puff as needed Inhalation q 4-6 hours prn cough/wheeze for 90 days   More than a month   cyclobenzaprine  (FLEXERIL ) 10 MG tablet  Take 1 tablet (10 mg total) by mouth 2 (two) times daily as needed for muscle spasms. 20 tablet 0    EPINEPHrine  0.3 mg/0.3 mL IJ SOAJ injection Inject 0.3 mg into the muscle as needed for anaphylaxis. 1 each 0    montelukast (SINGULAIR) 10 MG tablet Take 10 mg by mouth daily.      ondansetron  (ZOFRAN ) 4 MG tablet Take 1 tablet (4 mg total) by mouth every 8 (eight) hours as needed for nausea or vomiting. 20 tablet  More than a month   ondansetron  (ZOFRAN -ODT) 4 MG disintegrating tablet Take 1 tablet (4 mg total) by mouth every 8 (eight) hours as needed for nausea or vomiting. 30 tablet 1 More than a month    Review of Systems  Constitutional: Negative.   HENT: Negative.    Eyes: Negative.   Respiratory: Negative.    Cardiovascular: Negative.   Gastrointestinal:  Positive for nausea.  Endocrine: Negative.   Genitourinary:  Positive for pelvic pain (pelvic pressure).  Musculoskeletal: Negative.   Skin: Negative.   Allergic/Immunologic:  Negative.   Neurological:  Positive for headaches.  Hematological: Negative.   Psychiatric/Behavioral: Negative.     Physical Exam   Blood pressure 124/77, pulse 97, temperature 98.6 F (37 C), resp. rate 16, height 5' 3 (1.6 m), weight 93 kg, last menstrual period 09/14/2022, SpO2 96%.  Physical Exam Vitals and nursing note reviewed. Exam conducted with a chaperone present.  Constitutional:      Appearance: Normal appearance. She is normal weight.  HENT:     Head: Normocephalic and atraumatic.  Cardiovascular:     Rate and Rhythm: Normal rate.  Pulmonary:     Effort: Pulmonary effort is normal.  Abdominal:     Palpations: Abdomen is soft.  Genitourinary:    General: Normal vulva.     Comments: Dilation: 3 Effacement (%): 70 Station: -2 Exam by: Letha, CNM  Musculoskeletal:        General: Normal range of motion.  Skin:    General: Skin is warm and dry.  Neurological:     Mental Status: She is alert and oriented to person, place, and  time.  Psychiatric:        Mood and Affect: Mood normal.        Behavior: Behavior normal.        Thought Content: Thought content normal.        Judgment: Judgment normal.   Cervix: unchanged after 2 hours of observation  REACTIVE NST - FHR: 130 bpm / moderate variability / accels present / decels absent / TOCO: regular every 3-4 mins   Reassessment @ 1745: Patient reports nausea and H/A are resolved, but contractions are still just as uncomfortable and frequent.  MAU Course  Procedures  MDM CCUA -- patient unable to urinate during entire MAU visit Start IV LR 1 liter bolus @ 999 ml/hr x 2 Zofran  8 mg IVPB -- nausea resolved Excedrin Tension H/A 2 caplets -- nausea resolved  Assessment and Plan  1. Preterm uterine contractions in third trimester, antepartum (Primary) - Information provided on preterm contractions - Discussed 2/3-1-1 Rule: Return to MAU for painful contractions every 2-3 minutes, lasting 1 minute each for 1.5 hours. - Flexeril  10 mg po every 12 hrs prn pain. - Advised Flexeril  may help with low back pain and/or pelvic pain pressure.   2. Vomiting or nausea of pregnancy - Nausea resolved  3. Headache in pregnancy, antepartum, third trimester - H/A resolved  4. History of preterm delivery, currently pregnant in third trimester  5. [redacted] weeks gestation of pregnancy  - Discharge home - Keep scheduled appt with [facility] on 02/13/2024 - Advised ok to return to MAU, if contractions are closer and/or stronger together  Ala Letha, CNM 02/05/2024, 3:35 PM

## 2024-02-10 ENCOUNTER — Other Ambulatory Visit: Payer: Self-pay

## 2024-02-10 ENCOUNTER — Inpatient Hospital Stay (HOSPITAL_COMMUNITY): Admitting: Anesthesiology

## 2024-02-10 ENCOUNTER — Encounter (HOSPITAL_COMMUNITY): Payer: Self-pay | Admitting: Obstetrics and Gynecology

## 2024-02-10 ENCOUNTER — Inpatient Hospital Stay (HOSPITAL_COMMUNITY)
Admission: AD | Admit: 2024-02-10 | Discharge: 2024-02-12 | DRG: 807 | Disposition: A | Discharge status: Home / Self Care | Attending: Obstetrics and Gynecology | Admitting: Obstetrics and Gynecology

## 2024-02-10 DIAGNOSIS — O26893 Other specified pregnancy related conditions, third trimester: Secondary | ICD-10-CM | POA: Diagnosis not present

## 2024-02-10 DIAGNOSIS — Z833 Family history of diabetes mellitus: Secondary | ICD-10-CM

## 2024-02-10 DIAGNOSIS — Z3A00 Weeks of gestation of pregnancy not specified: Secondary | ICD-10-CM | POA: Diagnosis not present

## 2024-02-10 DIAGNOSIS — O99824 Streptococcus B carrier state complicating childbirth: Secondary | ICD-10-CM | POA: Diagnosis not present

## 2024-02-10 DIAGNOSIS — Z3A36 36 weeks gestation of pregnancy: Secondary | ICD-10-CM | POA: Diagnosis not present

## 2024-02-10 LAB — CBC
HCT: 34.8 % — ABNORMAL LOW (ref 36.0–46.0)
Hemoglobin: 11.4 g/dL — ABNORMAL LOW (ref 12.0–15.0)
MCH: 27.2 pg (ref 26.0–34.0)
MCHC: 32.8 g/dL (ref 30.0–36.0)
MCV: 83.1 fL (ref 80.0–100.0)
Platelets: 221 K/uL (ref 150–400)
RBC: 4.19 MIL/uL (ref 3.87–5.11)
RDW: 12.3 % (ref 11.5–15.5)
WBC: 12 K/uL — ABNORMAL HIGH (ref 4.0–10.5)
nRBC: 0 % (ref 0.0–0.2)

## 2024-02-10 LAB — TYPE AND SCREEN
ABO/RH(D): A POS
Antibody Screen: NEGATIVE

## 2024-02-10 LAB — RPR: RPR Ser Ql: NONREACTIVE

## 2024-02-10 MED ORDER — SENNOSIDES-DOCUSATE SODIUM 8.6-50 MG PO TABS
2.0000 | ORAL_TABLET | Freq: Every day | ORAL | Status: DC
  Administered 2024-02-11 – 2024-02-12 (×2): 2 via ORAL
  Filled 2024-02-10 (×2): qty 2

## 2024-02-10 MED ORDER — LACTATED RINGERS IV SOLN: INTRAVENOUS | Status: DC

## 2024-02-10 MED ORDER — OXYTOCIN-SODIUM CHLORIDE 30-0.9 UT/500ML-% IV SOLN
2.5000 [IU]/h | INTRAVENOUS | Status: DC
  Filled 2024-02-10: qty 500

## 2024-02-10 MED ORDER — FENTANYL-BUPIVACAINE-NACL 0.5-0.125-0.9 MG/250ML-% EP SOLN
12.0000 mL/h | EPIDURAL | Status: DC | PRN
  Administered 2024-02-10: 12 mL/h via EPIDURAL
  Filled 2024-02-10: qty 250

## 2024-02-10 MED ORDER — ONDANSETRON HCL 4 MG PO TABS: 4.0000 mg | ORAL_TABLET | ORAL | Status: DC | PRN

## 2024-02-10 MED ORDER — ZOLPIDEM TARTRATE 5 MG PO TABS: 5.0000 mg | ORAL_TABLET | Freq: Every evening | ORAL | Status: DC | PRN

## 2024-02-10 MED ORDER — EPHEDRINE 5 MG/ML INJ: 10.0000 mg | INTRAVENOUS | Status: DC | PRN

## 2024-02-10 MED ORDER — LACTATED RINGERS IV SOLN
500.0000 mL | Freq: Once | INTRAVENOUS | Status: AC
  Administered 2024-02-10: 500 mL via INTRAVENOUS

## 2024-02-10 MED ORDER — METHYLERGONOVINE MALEATE 0.2 MG/ML IJ SOLN: 0.2000 mg | Freq: Once | INTRAMUSCULAR | Status: AC

## 2024-02-10 MED ORDER — METHYLERGONOVINE MALEATE 0.2 MG/ML IJ SOLN
INTRAMUSCULAR | Status: AC
  Administered 2024-02-10: 0.2 mg via INTRAMUSCULAR
  Filled 2024-02-10: qty 1

## 2024-02-10 MED ORDER — PHENYLEPHRINE 80 MCG/ML (10ML) SYRINGE FOR IV PUSH (FOR BLOOD PRESSURE SUPPORT): 80.0000 ug | PREFILLED_SYRINGE | INTRAVENOUS | Status: DC | PRN

## 2024-02-10 MED ORDER — OXYTOCIN BOLUS FROM INFUSION
333.0000 mL | Freq: Once | INTRAVENOUS | Status: AC
  Administered 2024-02-10: 333 mL via INTRAVENOUS

## 2024-02-10 MED ORDER — LIDOCAINE HCL (PF) 1 % IJ SOLN: 30.0000 mL | INTRAMUSCULAR | Status: DC | PRN

## 2024-02-10 MED ORDER — COCONUT OIL OIL: 1.0000 | TOPICAL_OIL | Status: DC | PRN

## 2024-02-10 MED ORDER — LACTATED RINGERS IV SOLN: 500.0000 mL | INTRAVENOUS | Status: DC | PRN

## 2024-02-10 MED ORDER — FENTANYL CITRATE (PF) 100 MCG/2ML IJ SOLN: 50.0000 ug | INTRAMUSCULAR | Status: DC | PRN

## 2024-02-10 MED ORDER — DIPHENHYDRAMINE HCL 25 MG PO CAPS
25.0000 mg | ORAL_CAPSULE | Freq: Four times a day (QID) | ORAL | Status: DC | PRN
  Administered 2024-02-12: 25 mg via ORAL
  Filled 2024-02-10: qty 1

## 2024-02-10 MED ORDER — DIPHENHYDRAMINE HCL 50 MG/ML IJ SOLN: 12.5000 mg | INTRAMUSCULAR | Status: DC | PRN

## 2024-02-10 MED ORDER — TETANUS-DIPHTH-ACELL PERTUSSIS 5-2.5-18.5 LF-MCG/0.5 IM SUSY: 0.5000 mL | PREFILLED_SYRINGE | Freq: Once | INTRAMUSCULAR | Status: DC

## 2024-02-10 MED ORDER — DIBUCAINE (PERIANAL) 1 % EX OINT: 1.0000 | TOPICAL_OINTMENT | CUTANEOUS | Status: DC | PRN

## 2024-02-10 MED ORDER — WITCH HAZEL-GLYCERIN EX PADS: 1.0000 | MEDICATED_PAD | CUTANEOUS | Status: DC | PRN

## 2024-02-10 MED ORDER — ACETAMINOPHEN 325 MG PO TABS: 650.0000 mg | ORAL_TABLET | ORAL | Status: DC | PRN

## 2024-02-10 MED ORDER — BENZOCAINE-MENTHOL 20-0.5 % EX AERO
1.0000 | INHALATION_SPRAY | CUTANEOUS | Status: DC | PRN
  Administered 2024-02-10: 1 via TOPICAL
  Filled 2024-02-10: qty 56

## 2024-02-10 MED ORDER — ONDANSETRON HCL 4 MG/2ML IJ SOLN
4.0000 mg | Freq: Four times a day (QID) | INTRAMUSCULAR | Status: DC | PRN
  Administered 2024-02-10: 4 mg via INTRAVENOUS
  Filled 2024-02-10: qty 2

## 2024-02-10 MED ORDER — OXYCODONE-ACETAMINOPHEN 5-325 MG PO TABS: 1.0000 | ORAL_TABLET | ORAL | Status: DC | PRN

## 2024-02-10 MED ORDER — LIDOCAINE HCL (PF) 1 % IJ SOLN
INTRAMUSCULAR | Status: DC | PRN
  Administered 2024-02-10: 10 mL via EPIDURAL

## 2024-02-10 MED ORDER — SOD CITRATE-CITRIC ACID 500-334 MG/5ML PO SOLN
30.0000 mL | ORAL | Status: DC | PRN
Start: 2024-02-10 — End: 2024-02-10
  Administered 2024-02-10: 30 mL via ORAL
  Filled 2024-02-10: qty 30

## 2024-02-10 MED ORDER — SIMETHICONE 80 MG PO CHEW: 80.0000 mg | CHEWABLE_TABLET | ORAL | Status: DC | PRN

## 2024-02-10 MED ORDER — IBUPROFEN 600 MG PO TABS
600.0000 mg | ORAL_TABLET | Freq: Four times a day (QID) | ORAL | Status: DC
  Administered 2024-02-10 – 2024-02-12 (×10): 600 mg via ORAL
  Filled 2024-02-10 (×10): qty 1

## 2024-02-10 MED ORDER — SODIUM CHLORIDE 0.9 % IV SOLN
2.0000 g | Freq: Once | INTRAVENOUS | Status: AC
  Administered 2024-02-10: 2 g via INTRAVENOUS
  Filled 2024-02-10: qty 2000

## 2024-02-10 MED ORDER — PRENATAL MULTIVITAMIN CH
1.0000 | ORAL_TABLET | Freq: Every day | ORAL | Status: DC
  Administered 2024-02-10 – 2024-02-12 (×3): 1 via ORAL
  Filled 2024-02-10 (×3): qty 1

## 2024-02-10 MED ORDER — ONDANSETRON HCL 4 MG/2ML IJ SOLN: 4.0000 mg | INTRAMUSCULAR | Status: DC | PRN

## 2024-02-10 MED ORDER — ACETAMINOPHEN 325 MG PO TABS
650.0000 mg | ORAL_TABLET | ORAL | Status: DC | PRN
  Administered 2024-02-10 – 2024-02-11 (×2): 650 mg via ORAL
  Filled 2024-02-10 (×2): qty 2

## 2024-02-10 MED ORDER — OXYCODONE-ACETAMINOPHEN 5-325 MG PO TABS: 2.0000 | ORAL_TABLET | ORAL | Status: DC | PRN

## 2024-02-10 MED ORDER — SODIUM CHLORIDE 0.9 % IV SOLN
1.0000 g | INTRAVENOUS | Status: DC
  Administered 2024-02-10: 1 g via INTRAVENOUS
  Filled 2024-02-10: qty 1000

## 2024-02-10 NOTE — MAU Note (Signed)
 Amber [name] is a 23 y.o. at [redacted]w[redacted]d here in MAU reporting:   Pt states all day she has been ctxs but it is stronger now, ctxs every 2 min. No lof, some bloody show, +FM.  10/10 lower abdominal pain.   Pain score:10/10 ctxs pain   GBS +  Desires epidural.   Vitals:   02/10/24 0016  BP: 132/80  Pulse: (!) 104  Resp: 19  Temp: 98.1 F (36.7 C)  SpO2: 99%     FHT: 144  Lab orders placed from triage: labor eval.

## 2024-02-10 NOTE — Anesthesia Preprocedure Evaluation (Signed)
 Anesthesia Evaluation  Patient identified by MRN, date of birth, ID band Patient awake    Reviewed: Allergy & Precautions, NPO status , Patient's Chart, lab work & pertinent test results  History of Anesthesia Complications (+) PONV and history of anesthetic complications  Airway Mallampati: II  TM Distance: >3 FB Neck ROM: Full    Dental no notable dental hx.    Pulmonary asthma    Pulmonary exam normal breath sounds clear to auscultation       Cardiovascular negative cardio ROS Normal cardiovascular exam Rhythm:Regular Rate:Normal     Neuro/Psych negative neurological ROS  negative psych ROS   GI/Hepatic negative GI ROS, Neg liver ROS,,,  Endo/Other  negative endocrine ROS    Renal/GU negative Renal ROS  negative genitourinary   Musculoskeletal negative musculoskeletal ROS (+)    Abdominal   Peds  Hematology negative hematology ROS (+)   Anesthesia Other Findings Presents in labor  Reproductive/Obstetrics (+) Pregnancy                              Anesthesia Physical Anesthesia Plan  ASA: 2  Anesthesia Plan: Epidural   Post-op Pain Management:    Induction:   PONV Risk Score and Plan: Treatment may vary due to age or medical condition  Airway Management Planned: Natural Airway  Additional Equipment:   Intra-op Plan:   Post-operative Plan:   Informed Consent: I have reviewed the patients History and Physical, chart, labs and discussed the procedure including the risks, benefits and alternatives for the proposed anesthesia with the patient or authorized representative who has indicated his/her understanding and acceptance.       Plan Discussed with: Anesthesiologist  Anesthesia Plan Comments: (Patient identified. Risks, benefits, options discussed with patient including but not limited to bleeding, infection, nerve damage, paralysis, failed block, incomplete pain  control, headache, blood pressure changes, nausea, vomiting, reactions to medication, itching, and post partum back pain. Confirmed with bedside nurse the patient's most recent platelet count. Confirmed with the patient that they are not taking any anticoagulation, have any bleeding history or any family history of bleeding disorders. Patient expressed understanding and wishes to proceed. All questions were answered. )         Anesthesia Quick Evaluation

## 2024-02-10 NOTE — Anesthesia Procedure Notes (Signed)
 Epidural Patient location during procedure: OB Start time: 02/10/2024 1:00 AM End time: 02/10/2024 1:10 AM  Staffing Anesthesiologist: Niels Marien CROME, MD Performed: anesthesiologist   Preanesthetic Checklist Completed: patient identified, IV checked, risks and benefits discussed, monitors and equipment checked, pre-op evaluation and timeout performed  Epidural Patient position: sitting Prep: DuraPrep and site prepped and draped Patient monitoring: continuous pulse ox, blood pressure, heart rate and cardiac monitor Approach: midline Location: L3-L4 Injection technique: LOR air  Needle:  Needle type: Tuohy  Needle gauge: 17 G Needle length: 9 cm Needle insertion depth: 7 cm Catheter type: closed end flexible Catheter size: 19 Gauge Catheter at skin depth: 12 cm Test dose: negative  Assessment Sensory level: T8 Events: blood not aspirated, no cerebrospinal fluid, injection not painful, no injection resistance, no paresthesia and negative IV test  Additional Notes Patient identified. Risks/Benefits/Options discussed with patient including but not limited to bleeding, infection, nerve damage, paralysis, failed block, incomplete pain control, headache, blood pressure changes, nausea, vomiting, reactions to medication both or allergic, itching and postpartum back pain. Confirmed with bedside nurse the patient's most recent platelet count. Confirmed with patient that they are not currently taking any anticoagulation, have any bleeding history or any family history of bleeding disorders. Patient expressed understanding and wished to proceed. All questions were answered. Sterile technique was used throughout the entire procedure. Please see nursing notes for vital signs. Test dose was given through epidural catheter and negative prior to continuing to dose epidural or start infusion. Warning signs of high block given to the patient including shortness of breath, tingling/numbness in hands,  complete motor block, or any concerning symptoms with instructions to call for help. Patient was given instructions on fall risk and not to get out of bed. All questions and concerns addressed with instructions to call with any issues or inadequate analgesia.  Reason for block:procedure for pain

## 2024-02-10 NOTE — Progress Notes (Signed)
 Post Partum Day 0 Subjective: Patient is doing well. Pain is controlled. Ambulating, voiding, tolerating PO. Minimal lochia.   Objective: Patient Vitals for the past 24 hrs:  BP Temp Temp src Pulse Resp SpO2  02/10/24 1117 125/69 98 F (36.7 C) -- 86 18 98 %  02/10/24 0734 122/80 98.6 F (37 C) Oral 85 18 100 %  02/10/24 0635 112/76 98.6 F (37 C) Oral 94 18 99 %  02/10/24 0545 111/64 -- -- 92 -- --  02/10/24 0530 107/71 -- -- 87 -- --  02/10/24 0515 112/75 -- -- 95 -- --  02/10/24 0500 129/74 -- -- 100 -- --  02/10/24 0445 (!) 128/48 -- -- 93 -- --  02/10/24 0401 116/81 -- -- 84 -- --  02/10/24 0350 -- 98.8 F (37.1 C) Oral -- -- --  02/10/24 0330 95/68 -- -- 78 -- 98 %  02/10/24 0300 115/77 -- -- 82 -- 98 %  02/10/24 0230 122/81 -- -- 87 -- 99 %  02/10/24 0200 116/76 -- -- (!) 111 -- 100 %  02/10/24 0139 (!) 127/57 -- -- 90 -- 99 %  02/10/24 0126 116/62 -- -- 76 -- 99 %  02/10/24 0115 118/71 -- -- 84 -- 99 %  02/10/24 0105 137/83 -- -- 93 -- 99 %  02/10/24 0100 (!) 106/57 98.1 F (36.7 C) Oral (!) 112 -- --  02/10/24 0016 132/80 98.1 F (36.7 C) Oral (!) 104 19 99 %    Physical Exam:  General: alert, cooperative, and no distress Lochia: appropriate Uterine Fundus: firm DVT Evaluation: No evidence of DVT seen on physical exam.  Recent Labs    02/10/24 0035  WBC 12.0*  HGB 11.4*  HCT 34.8*  PLT 221    No results for input(s): NA, K, CL, CO2CT, BUN, CREATININE, GLUCOSE, BILITOT, ALT, AST, ALKPHOS, PROT, ALBUMIN in the last 72 hours.  No results for input(s): CALCIUM, MG, PHOS in the last 72 hours.  No results for input(s): PROTIME, APTT, INR in the last 72 hours.  No results for input(s): PROTIME, APTT, INR, FIBRINOGEN in the last 72 hours. Assessment/Plan: IJANAE MACAPAGAL 23 y.o. [id number] PPD#0 sp SVD at [redacted]w[redacted]d 1. PPC: routine PP care 2. Rh pos 3. Plan for circ tomorrow 4. Desires interval salpingectomy  5.  Dispo: anticipate discharge PPD2   LOS: 0 days   Rosaline FORBES Chapel 02/10/2024, 2:18 PM

## 2024-02-10 NOTE — H&P (Addendum)
 Amber [name] is a 23 y.o. female presenting for evaluation of contractions. Has felt them on and off all day but notes q38min in last hour. Denies LOF, some bloody show, +FM  PNC c/b 1) H/o preterm delivery: h/o PPROM / PTB at 35 weeks. Plan serial TVCL at 16 weeks 3.5cm WNL 2) H/o PreE: h/o preeclampsia: diagnosed at time of delivery with last pregnancy, no meds PP. LDA at 12 weeks 3) Satisfied fertility - pp BTL planned in form of salpingectomy per 7/2 OV OB History     Gravida  2   Para  1   Term      Preterm  1   AB      Living  1      SAB      IAB      Ectopic      Multiple  0   Live Births  1          Past Medical History:  Diagnosis Date   Asthma    PONV (postoperative nausea and vomiting)    Skull fracture (HCC)    at 38 months old   Past Surgical History:  Procedure Laterality Date   INNER EAR SURGERY Left    Family History: family history includes Diabetes in her mother. Social History:  reports that she has never smoked. She has never used smokeless tobacco. She reports that she does not drink alcohol and does not use drugs.     Maternal Diabetes: No1hr 149, 3hr 0 of 4 Genetic Screening: Normal Maternal Ultrasounds/Referrals: Normal Fetal Ultrasounds or other Referrals:  None Maternal Substance Abuse:  No Significant Maternal Medications:  None Significant Maternal Lab Results:  None Number of Prenatal Visits:greater than 3 verified prenatal visits Other Comments:  None  Review of Systems  Constitutional:  Negative for chills and fever.  Respiratory:  Negative for shortness of breath.   Cardiovascular:  Negative for chest pain, palpitations and leg swelling.  Gastrointestinal:  Negative for abdominal pain, nausea and vomiting.  Neurological:  Negative for dizziness, weakness and headaches.  Psychiatric/Behavioral:  Negative for suicidal ideas.    Maternal Medical History:  Reason for admission: Contractions.   Nausea.  Contractions: Onset was 1-2 hours ago.   Frequency: regular.   Perceived severity is strong.   Fetal activity: Perceived fetal activity is normal.   Prenatal complications: No bleeding.   Prenatal Complications - Diabetes: none.   Dilation: 7.5 Effacement (%): 70 Station: -2 Exam by:: Amber Loges RN Blood pressure 132/80, pulse (!) 104, temperature 98.1 F (36.7 C), temperature source Oral, resp. rate 19, last menstrual period 09/14/2022, SpO2 99%. Exam Physical Exam Constitutional:      General: She is not in acute distress.    Appearance: She is well-developed.  HENT:     Head: Normocephalic and atraumatic.  Eyes:     Pupils: Pupils are equal, round, and reactive to light.  Cardiovascular:     Rate and Rhythm: Normal rate and regular rhythm.     Heart sounds: No murmur heard.    No gallop.  Abdominal:     Tenderness: There is no abdominal tenderness. There is no guarding or rebound.  Genitourinary:    Vagina: Normal.  Musculoskeletal:        General: Normal range of motion.     Cervical back: Normal range of motion and neck supple.  Skin:    General: Skin is warm and dry.  Neurological:     Mental Status:  She is alert and oriented to person, place, and time.     Prenatal labs: ABO, Rh:  Apos Antibody:  neg Rubella:  imm RPR:   nr HBsAg:   neg HIV:   nr GBS:   POSITIVE (7/2 in-house)  Cat 1 tracing with baseline 150, mod var, + accels, no decels TOCO q25min CE 7-8/90/-1  Assessment/Plan: This is a 23yo G2P0101 @ 36 0/7 by LMP c/w 8wk TVUS presenting in active preterm labor.  -GBS pos: Ampicillin  ordered, keep intact -Satisfied fertility: unable to do pp BTL on night shift, will discuss Medicaid forms with patient after delivery. Per office notes, plan bilateral salpingectomy in interval procedure -Pelvis proven to 6lb1oz Anticipate SVD   Amber [name] 02/10/2024, 12:33 AM

## 2024-02-10 NOTE — Progress Notes (Signed)
 Patient now comfortable s/p epidural. Pain down to 5/10. Denies vaginal/rectal pressure.e Reviewed postpartum stay in setting of GBS pos status with active labor admit. CE now 8/90/-1, cat 1 tracing with early decels. Close eye on status BP 118/71   Pulse 84   Temp 98.1 F (36.7 C) (Oral)   Resp 19   LMP 09/14/2022   SpO2 99%

## 2024-02-10 NOTE — Progress Notes (Signed)
 Lab called and stated cbc needs redrawn, specimen clotted and not enough in tube. New order placed.

## 2024-02-10 NOTE — Anesthesia Postprocedure Evaluation (Signed)
 Anesthesia Post Note  Patient: Amber [name]  Procedure(s) Performed: AN AD HOC LABOR EPIDURAL     Patient location during evaluation: Mother Baby Anesthesia Type: Epidural Level of consciousness: awake and alert Pain management: pain level controlled Vital Signs Assessment: post-procedure vital signs reviewed and stable Respiratory status: spontaneous breathing, nonlabored ventilation and respiratory function stable Cardiovascular status: stable Postop Assessment: no headache, no backache and epidural receding Anesthetic complications: no   No notable events documented.  Last Vitals:  Vitals:   02/10/24 0734 02/10/24 1117  BP: 122/80 125/69  Pulse: 85 86  Resp: 18 18  Temp: 37 C 36.7 C  SpO2: 100% 98%    Last Pain:  Vitals:   02/10/24 1116  TempSrc:   PainSc: 0-No pain   Pain Goal:                   [name]

## 2024-02-10 NOTE — Progress Notes (Signed)
 Ampicillin  being rehung at this time. Clear AROM with copious fluid return. CE ant lip/0 station. TOCO q63min. Cat 1 tracing with early decels, mod var, baseline 140. Anticipate SVD BP 95/68   Pulse 78   Temp 98.8 F (37.1 C) (Oral)   Resp 19   LMP 09/14/2022   SpO2 98%

## 2024-02-11 LAB — CBC
HCT: 31.3 % — ABNORMAL LOW (ref 36.0–46.0)
Hemoglobin: 10.2 g/dL — ABNORMAL LOW (ref 12.0–15.0)
MCH: 27.1 pg (ref 26.0–34.0)
MCHC: 32.6 g/dL (ref 30.0–36.0)
MCV: 83.2 fL (ref 80.0–100.0)
Platelets: 196 K/uL (ref 150–400)
RBC: 3.76 MIL/uL — ABNORMAL LOW (ref 3.87–5.11)
RDW: 12.6 % (ref 11.5–15.5)
WBC: 10.7 K/uL — ABNORMAL HIGH (ref 4.0–10.5)
nRBC: 0 % (ref 0.0–0.2)

## 2024-02-11 NOTE — Progress Notes (Signed)
 Post Partum Day 1 Subjective: No concerns.  Ambulating and voiding without difficulty.  Denies nausea or vomiting.  Objective: Blood pressure 115/79, pulse 76, temperature 98.6 F (37 C), resp. rate 18, last menstrual period 09/14/2022, SpO2 98%, unknown if currently breastfeeding.  Physical Exam:  General: alert, cooperative, and appears stated age Lochia: appropriate Uterine Fundus: firm DVT Evaluation: No evidence of DVT seen on physical exam.  Recent Labs    02/10/24 0035 02/11/24 0536  HGB 11.4* 10.2*  HCT 34.8* 31.3*    Assessment/Plan: Plan for discharge tomorrow Desires neonatal circumcision, R/B/A of procedure discussed at length. Pt understands that neonatal circumcision is not considered medically necessary and is elective. The risks include, but are not limited to bleeding, infection, damage to the penis, development of scar tissue, and having to have it redone at a later date. Pt understands theses risks and wishes to proceed    LOS: 1 day   Marjorie Gull, MD 02/11/2024, 2:00 PM

## 2024-02-12 MED ORDER — IBUPROFEN 600 MG PO TABS: 600.0000 mg | ORAL_TABLET | Freq: Four times a day (QID) | ORAL | 1 refills | Status: AC | PRN

## 2024-02-12 NOTE — Progress Notes (Signed)
 Post Partum Day 2 Subjective: no complaints, up ad lib, voiding, tolerating PO, + flatus, and lochia mild. She is bonding well with baby - bottlefeeding. Baby needs bilirubin treatment so likely to room in otherwise feels ready for discharge. No CP, SOB or HA.   Objective: Blood pressure 111/80, pulse 74, temperature 98.1 F (36.7 C), temperature source Oral, resp. rate 18, last menstrual period 09/14/2022, SpO2 98%, unknown if currently breastfeeding.  Physical Exam:  General: alert, cooperative, and no distress Lochia: appropriate Uterine Fundus: firm Incision: n/a DVT Evaluation: No evidence of DVT seen on physical exam.  Recent Labs    02/10/24 0035 02/11/24 0536  HGB 11.4* 10.2*  HCT 34.8* 31.3*    Assessment/Plan: Discharge home and Contraception planning pp BTL    LOS: 2 days   Amber [name] W Amber Cubit, DO 02/12/2024, 11:19 AM

## 2024-02-12 NOTE — Discharge Instructions (Signed)
 Call office with any concerns [phone]

## 2024-02-12 NOTE — Discharge Summary (Signed)
 Postpartum Discharge Summary  Date of Service updated      Patient Name: Amber [name] DOB: 2001-04-15 MRN: [id number]  Date of admission: 02/10/2024 Delivery date:02/10/2024 Delivering provider: SUDIE LAVONIA HERO Date of discharge: 02/12/2024  Admitting diagnosis: Preterm labor [O60.00] Intrauterine pregnancy: [redacted]w[redacted]d     Secondary diagnosis:  Principal Problem:   Preterm labor  Additional problems: none    Discharge diagnosis: Preterm Pregnancy Delivered                                              Post partum procedures:n/a Augmentation: N/A Complications: None  Hospital course: Onset of Labor With Vaginal Delivery      23 y.o. yo G2P0202 at [redacted]w[redacted]d was admitted in Active Labor on 02/10/2024. Labor course was complicated by none  Membrane Rupture Time/Date: 4:07 AM,02/10/2024  Delivery Method:Vaginal, Spontaneous Operative Delivery:N/A Episiotomy: None Lacerations:  2nd degree Patient had a postpartum course complicated by  none.  She is ambulating, tolerating a regular diet, passing flatus, and urinating well. Patient is discharged home in stable condition on 02/12/24.  Newborn Data: Birth date:02/10/2024 Birth time:4:37 AM Gender:Female Living status:Living Apgars:5 ,9  Weight:3840 g  Magnesium Sulfate received: No BMZ received: No Rhophylac:N/A MMR:N/A T-DaP:Given prenatally Flu: N/A RSV Vaccine received: No Transfusion:No Immunizations administered: Immunization History  Administered Date(s) Administered   MMR 05/16/2019    Physical exam  Vitals:   02/10/24 1947 02/11/24 0554 02/11/24 1407 02/12/24 0534  BP: 121/78 115/79 111/64 111/80  Pulse: 82 76 74 74  Resp: 18 18 16 18   Temp: 98.2 F (36.8 C) 98.6 F (37 C) 98.2 F (36.8 C) 98.1 F (36.7 C)  TempSrc: Oral  Oral Oral  SpO2: 98% 98% 99% 98%   General: alert, cooperative, and no distress Lochia: appropriate Uterine Fundus: firm Incision: N/A DVT Evaluation: No evidence of DVT seen on  physical exam. Labs: Lab Results  Component Value Date   WBC 10.7 (H) 02/11/2024   HGB 10.2 (L) 02/11/2024   HCT 31.3 (L) 02/11/2024   MCV 83.2 02/11/2024   PLT 196 02/11/2024      Latest Ref Rng & Units 08/10/2023    3:08 PM  CMP  Glucose 70 - 99 mg/dL 88   BUN 6 - 20 mg/dL 6   Creatinine 9.55 - 8.99 mg/dL 9.53   Sodium 864 - 854 mmol/L 134   Potassium 3.5 - 5.1 mmol/L 3.6   Chloride 98 - 111 mmol/L 104   CO2 22 - 32 mmol/L 17   Calcium 8.9 - 10.3 mg/dL 9.2   Total Protein 6.5 - 8.1 g/dL 6.9   Total Bilirubin 0.0 - 1.2 mg/dL 0.6   Alkaline Phos 38 - 126 U/L 60   AST 15 - 41 U/L 28   ALT 0 - 44 U/L 47    Edinburgh Score:    02/10/2024    5:37 PM  Edinburgh Postnatal Depression Scale Screening Tool  I have been able to laugh and see the funny side of things. 0  I have looked forward with enjoyment to things. 0  I have blamed myself unnecessarily when things went wrong. 0  I have been anxious or worried for no good reason. 0  I have felt scared or panicky for no good reason. 0  Things have been getting on top of me. 0  I have  been so unhappy that I have had difficulty sleeping. 0  I have felt sad or miserable. 0  I have been so unhappy that I have been crying. 0  The thought of harming myself has occurred to me. 0  Edinburgh Postnatal Depression Scale Total 0      After visit meds:  Allergies as of 02/12/2024   No Known Allergies      Medication List     TAKE these medications    albuterol 108 (90 Base) MCG/ACT inhaler Commonly known as: VENTOLIN HFA 1-2 puff as needed Inhalation q 4-6 hours prn cough/wheeze for 90 days   aspirin EC 81 MG tablet Take 81 mg by mouth daily. Swallow whole.   cyclobenzaprine  10 MG tablet Commonly known as: FLEXERIL  Take 1 tablet (10 mg total) by mouth 2 (two) times daily as needed for muscle spasms.   cyclobenzaprine  10 MG tablet Commonly known as: FLEXERIL  Take 1 tablet (10 mg total) by mouth 2 (two) times daily as  needed.   EPINEPHrine  0.3 mg/0.3 mL Soaj injection Commonly known as: EPI-PEN Inject 0.3 mg into the muscle as needed for anaphylaxis.   ibuprofen  600 MG tablet Commonly known as: ADVIL  Take 1 tablet (600 mg total) by mouth every 6 (six) hours as needed for moderate pain (pain score 4-6) or cramping.   montelukast 10 MG tablet Commonly known as: SINGULAIR Take 10 mg by mouth daily.   multivitamin-prenatal 27-0.8 MG Tabs tablet Take 1 tablet by mouth daily at 12 noon.   ondansetron  4 MG disintegrating tablet Commonly known as: ZOFRAN -ODT Take 1 tablet (4 mg total) by mouth every 8 (eight) hours as needed for nausea or vomiting.   ondansetron  4 MG tablet Commonly known as: ZOFRAN  Take 1 tablet (4 mg total) by mouth every 8 (eight) hours as needed for nausea or vomiting.         Discharge home in stable condition Infant Feeding: Bottle Infant Disposition:rooming in Discharge instruction: per After Visit Summary and Postpartum booklet. Activity: Advance as tolerated. Pelvic rest for 6 weeks.  Diet: routine diet Anticipated Birth Control: desires pp BTL  Postpartum Appointment:6 weeks Additional Postpartum F/U: Postpartum Depression checkup Future Appointments:No future appointments. Follow up Visit:  Follow-up Information     Ob/Gyn, Landy Stains. Schedule an appointment as soon as possible for a visit in 6 week(s).   Why: For postpartum visit Contact information: [address] [place] [address] [phone]                     02/12/2024 Ted LELON Solo, DO

## 2024-02-13 LAB — SURGICAL PATHOLOGY

## 2024-02-25 ENCOUNTER — Telehealth (HOSPITAL_COMMUNITY): Payer: Self-pay | Admitting: *Deleted

## 2024-02-25 NOTE — Telephone Encounter (Signed)
 02/25/2024  Name: Amber [name] MRN: [id number] DOB: [birth year]  Reason for Call:  Transition of Care Hospital Discharge Call  Contact Status: Patient Contact Status: Message  Language assistant needed:          Follow-Up Questions:    Van Postnatal Depression Scale:  In the Past 7 Days:    PHQ2-9 Depression Scale:     Discharge Follow-up:    Post-discharge interventions: NA  Mliss Sieve, RN 02/25/2024 15:17

## 2024-03-15 ENCOUNTER — Inpatient Hospital Stay (HOSPITAL_COMMUNITY): Admission: RE | Admit: 2024-03-15 | Source: Home / Self Care | Admitting: Obstetrics

## 2024-03-15 ENCOUNTER — Inpatient Hospital Stay (HOSPITAL_COMMUNITY)

## 2024-03-17 DIAGNOSIS — Z1331 Encounter for screening for depression: Secondary | ICD-10-CM | POA: Diagnosis not present

## 2024-03-24 DIAGNOSIS — Z3042 Encounter for surveillance of injectable contraceptive: Secondary | ICD-10-CM | POA: Diagnosis not present

## 2024-03-24 DIAGNOSIS — Z3202 Encounter for pregnancy test, result negative: Secondary | ICD-10-CM | POA: Diagnosis not present

## 2024-04-06 DIAGNOSIS — E669 Obesity, unspecified: Secondary | ICD-10-CM | POA: Diagnosis not present

## 2024-04-20 DIAGNOSIS — Z7182 Exercise counseling: Secondary | ICD-10-CM | POA: Diagnosis not present

## 2024-04-20 DIAGNOSIS — Z6831 Body mass index (BMI) 31.0-31.9, adult: Secondary | ICD-10-CM | POA: Diagnosis not present

## 2024-04-20 DIAGNOSIS — Z1331 Encounter for screening for depression: Secondary | ICD-10-CM | POA: Diagnosis not present

## 2024-04-20 DIAGNOSIS — Z713 Dietary counseling and surveillance: Secondary | ICD-10-CM | POA: Diagnosis not present

## 2024-04-20 DIAGNOSIS — Z1389 Encounter for screening for other disorder: Secondary | ICD-10-CM | POA: Diagnosis not present

## 2024-04-20 DIAGNOSIS — Z13228 Encounter for screening for other metabolic disorders: Secondary | ICD-10-CM | POA: Diagnosis not present

## 2024-04-20 DIAGNOSIS — E66811 Obesity, class 1: Secondary | ICD-10-CM | POA: Diagnosis not present

## 2024-04-27 DIAGNOSIS — E66811 Obesity, class 1: Secondary | ICD-10-CM | POA: Diagnosis not present

## 2024-04-27 DIAGNOSIS — Z6831 Body mass index (BMI) 31.0-31.9, adult: Secondary | ICD-10-CM | POA: Diagnosis not present

## 2024-04-27 DIAGNOSIS — Z713 Dietary counseling and surveillance: Secondary | ICD-10-CM | POA: Diagnosis not present

## 2024-04-27 DIAGNOSIS — E6609 Other obesity due to excess calories: Secondary | ICD-10-CM | POA: Diagnosis not present

## 2024-05-24 DIAGNOSIS — L509 Urticaria, unspecified: Secondary | ICD-10-CM | POA: Diagnosis not present

## 2024-05-24 DIAGNOSIS — Z6831 Body mass index (BMI) 31.0-31.9, adult: Secondary | ICD-10-CM | POA: Diagnosis not present

## 2024-05-24 DIAGNOSIS — E66811 Obesity, class 1: Secondary | ICD-10-CM | POA: Diagnosis not present

## 2024-06-09 ENCOUNTER — Ambulatory Visit: Admitting: Allergy

## 2024-06-22 DIAGNOSIS — Z6830 Body mass index (BMI) 30.0-30.9, adult: Secondary | ICD-10-CM | POA: Diagnosis not present

## 2024-06-22 DIAGNOSIS — E66811 Obesity, class 1: Secondary | ICD-10-CM | POA: Diagnosis not present

## 2024-06-22 DIAGNOSIS — Z3042 Encounter for surveillance of injectable contraceptive: Secondary | ICD-10-CM | POA: Diagnosis not present

## 2024-06-22 DIAGNOSIS — R634 Abnormal weight loss: Secondary | ICD-10-CM | POA: Diagnosis not present

## 2024-06-28 DIAGNOSIS — J029 Acute pharyngitis, unspecified: Secondary | ICD-10-CM | POA: Diagnosis not present

## 2024-06-28 DIAGNOSIS — R059 Cough, unspecified: Secondary | ICD-10-CM | POA: Diagnosis not present

## 2024-06-28 DIAGNOSIS — R52 Pain, unspecified: Secondary | ICD-10-CM | POA: Diagnosis not present

## 2024-07-06 ENCOUNTER — Emergency Department (HOSPITAL_BASED_OUTPATIENT_CLINIC_OR_DEPARTMENT_OTHER)

## 2024-07-06 ENCOUNTER — Ambulatory Visit

## 2024-07-06 ENCOUNTER — Other Ambulatory Visit: Payer: Self-pay

## 2024-07-06 ENCOUNTER — Emergency Department (HOSPITAL_BASED_OUTPATIENT_CLINIC_OR_DEPARTMENT_OTHER)
Admission: EM | Admit: 2024-07-06 | Discharge: 2024-07-06 | Disposition: A | Discharge status: Home / Self Care | Attending: Emergency Medicine | Admitting: Emergency Medicine

## 2024-07-06 ENCOUNTER — Encounter (HOSPITAL_BASED_OUTPATIENT_CLINIC_OR_DEPARTMENT_OTHER): Payer: Self-pay | Admitting: Emergency Medicine

## 2024-07-06 DIAGNOSIS — H66002 Acute suppurative otitis media without spontaneous rupture of ear drum, left ear: Secondary | ICD-10-CM | POA: Diagnosis not present

## 2024-07-06 DIAGNOSIS — R112 Nausea with vomiting, unspecified: Secondary | ICD-10-CM | POA: Diagnosis not present

## 2024-07-06 DIAGNOSIS — R519 Headache, unspecified: Secondary | ICD-10-CM | POA: Diagnosis not present

## 2024-07-06 DIAGNOSIS — R197 Diarrhea, unspecified: Secondary | ICD-10-CM | POA: Diagnosis not present

## 2024-07-06 LAB — URINALYSIS, ROUTINE W REFLEX MICROSCOPIC
Bacteria, UA: NONE SEEN
RBC / HPF: >50 RBC/hpf (ref 0–5)

## 2024-07-06 LAB — HEPATITIS PANEL, ACUTE
HCV Ab: NONREACTIVE
Hep A IgM: NONREACTIVE
Hep B C IgM: NONREACTIVE
Hepatitis B Surface Ag: NONREACTIVE

## 2024-07-06 LAB — CBC WITH DIFFERENTIAL/PLATELET
Abs Immature Granulocytes: 0.06 K/uL (ref 0.00–0.07)
Basophils Absolute: 0 K/uL (ref 0.0–0.1)
Basophils Relative: 0 %
Eosinophils Absolute: 0.1 K/uL (ref 0.0–0.5)
Eosinophils Relative: 1 %
HCT: 36.3 % (ref 36.0–46.0)
Hemoglobin: 12.4 g/dL (ref 12.0–15.0)
Immature Granulocytes: 1 %
Lymphocytes Relative: 33 %
Lymphs Abs: 2.8 K/uL (ref 0.7–4.0)
MCH: 27.3 pg (ref 26.0–34.0)
MCHC: 34.2 g/dL (ref 30.0–36.0)
MCV: 80 fL (ref 80.0–100.0)
Monocytes Absolute: 0.4 K/uL (ref 0.1–1.0)
Monocytes Relative: 4 %
Neutro Abs: 5.1 K/uL (ref 1.7–7.7)
Neutrophils Relative %: 61 %
Platelets: 374 K/uL (ref 150–400)
RBC: 4.54 MIL/uL (ref 3.87–5.11)
RDW: 13.9 % (ref 11.5–15.5)
WBC: 8.3 K/uL (ref 4.0–10.5)
nRBC: 0 % (ref 0.0–0.2)

## 2024-07-06 LAB — COMPREHENSIVE METABOLIC PANEL WITH GFR
ALT: 318 U/L — ABNORMAL HIGH (ref 0–44)
AST: 192 U/L — ABNORMAL HIGH (ref 15–41)
Albumin: 4.5 g/dL (ref 3.5–5.0)
Alkaline Phosphatase: 101 U/L (ref 38–126)
Anion gap: 15 (ref 5–15)
BUN: 11 mg/dL (ref 6–20)
CO2: 22 mmol/L (ref 22–32)
Calcium: 9.5 mg/dL (ref 8.9–10.3)
Chloride: 105 mmol/L (ref 98–111)
Creatinine, Ser: 0.58 mg/dL (ref 0.44–1.00)
GFR, Estimated: >60 mL/min (ref >60)
Glucose, Bld: 90 mg/dL (ref 70–99)
Potassium: 3.4 mmol/L — ABNORMAL LOW (ref 3.5–5.1)
Sodium: 141 mmol/L (ref 135–145)
Total Bilirubin: 0.4 mg/dL (ref 0.0–1.2)
Total Protein: 7.2 g/dL (ref 6.5–8.1)

## 2024-07-06 LAB — URINALYSIS, MICROSCOPIC (REFLEX)
Bacteria, UA: NONE SEEN
RBC / HPF: >50 RBC/hpf (ref 0–5)

## 2024-07-06 LAB — LIPASE, BLOOD: Lipase: 23 U/L (ref 11–51)

## 2024-07-06 LAB — PREGNANCY, URINE: Preg Test, Ur: NEGATIVE

## 2024-07-06 MED ORDER — SODIUM CHLORIDE 0.9 % IV SOLN
12.5000 mg | Freq: Once | INTRAVENOUS | Status: AC
  Administered 2024-07-06: 12.5 mg via INTRAVENOUS
  Filled 2024-07-06: qty 0.5

## 2024-07-06 MED ORDER — ONDANSETRON HCL 4 MG PO TABS: 4.0000 mg | ORAL_TABLET | Freq: Four times a day (QID) | ORAL | 0 refills | Status: DC

## 2024-07-06 MED ORDER — ONDANSETRON HCL 4 MG/2ML IJ SOLN
4.0000 mg | Freq: Once | INTRAMUSCULAR | Status: AC
  Administered 2024-07-06: 4 mg via INTRAVENOUS
  Filled 2024-07-06: qty 2

## 2024-07-06 MED ORDER — FAMOTIDINE IN NACL 20-0.9 MG/50ML-% IV SOLN
20.0000 mg | Freq: Once | INTRAVENOUS | Status: AC
  Administered 2024-07-06: 20 mg via INTRAVENOUS
  Filled 2024-07-06: qty 50

## 2024-07-06 MED ORDER — PROMETHAZINE HCL 12.5 MG PO TABS: 12.5000 mg | ORAL_TABLET | Freq: Four times a day (QID) | ORAL | 0 refills | Status: AC | PRN

## 2024-07-06 MED ORDER — PROMETHAZINE HCL 25 MG/ML IJ SOLN
INTRAMUSCULAR | Status: AC
  Filled 2024-07-06: qty 1

## 2024-07-06 MED ORDER — LACTATED RINGERS IV BOLUS
1000.0000 mL | Freq: Once | INTRAVENOUS | Status: AC
  Administered 2024-07-06: 1000 mL via INTRAVENOUS

## 2024-07-06 NOTE — Discharge Instructions (Signed)
 Evaluated in the ER today for nausea vomiting diarrhea for the past 2 weeks.  You had elevated liver function test in the urgent care today.  They are slightly elevated here today as well.  Please follow-up with gastroenterology.  They will need to recheck these in a couple of weeks.  Drink plain fluids, rest, use the Zofran  and Phenergan  as needed for nausea.  If you are still having vomiting despite medications at home, not keeping anything down please come back to the ER right away.

## 2024-07-06 NOTE — ED Provider Notes (Signed)
 [place] EMERGENCY DEPARTMENT AT [facility] Provider Note   CSN: [id number] Arrival date & time: 07/06/24  1520     Patient presents with: Emesis   Amber [name] is a 23 y.o. female.  He has history of asthma, presents the ER today for evaluation of nausea vomiting diarrhea for 1 week.  States every time she eats or drinks anything she meetly vomits and has diarrhea.  She had been trying to take Tylenol , but has been vomiting it back up, denies having taken any more than directed on packaging.  Having some mild upper abdominal discomfort denies any severe pain, denies fevers or chills.  Denies any hematemesis or hematochezia or melena.  States she is currently on her menstrual period.  She went to urgent care today and was told that her sodium was high and liver enzymes were elevated and advised further workup in the ER.    Emesis      Prior to Admission medications   Medication Sig Start Date End Date Taking? Authorizing Provider  albuterol (VENTOLIN HFA) 108 (90 Base) MCG/ACT inhaler 1-2 puff as needed Inhalation q 4-6 hours prn cough/wheeze for 90 days 01/10/21   [provider]  aspirin EC 81 MG tablet Take 81 mg by mouth daily. Swallow whole.    [provider]  cyclobenzaprine  (FLEXERIL ) 10 MG tablet Take 1 tablet (10 mg total) by mouth 2 (two) times daily as needed for muscle spasms. 02/05/24   Dawson, Rolitta, CNM  cyclobenzaprine  (FLEXERIL ) 10 MG tablet Take 1 tablet (10 mg total) by mouth 2 (two) times daily as needed. 02/05/24   Letha Renshaw, CNM  EPINEPHrine  0.3 mg/0.3 mL IJ SOAJ injection Inject 0.3 mg into the muscle as needed for anaphylaxis. 12/02/20   McDonald, Mia A, PA-C  ibuprofen  (ADVIL ) 600 MG tablet Take 1 tablet (600 mg total) by mouth every 6 (six) hours as needed for moderate pain (pain score 4-6) or cramping. 02/12/24   Banga, Cecilia Worema, DO  montelukast (SINGULAIR) 10 MG tablet Take 10 mg by mouth daily. 08/27/21   [provider]  ondansetron  (ZOFRAN ) 4 MG tablet Take 1 tablet (4 mg total) by mouth every 8 (eight) hours as needed for nausea or vomiting. 08/10/23   Ival Domino, FNP  ondansetron  (ZOFRAN -ODT) 4 MG disintegrating tablet Take 1 tablet (4 mg total) by mouth every 8 (eight) hours as needed for nausea or vomiting. 08/10/23   Nicholaus Almarie HERO, MD  Prenatal Vit-Fe Fumarate-FA (MULTIVITAMIN-PRENATAL) 27-0.8 MG TABS tablet Take 1 tablet by mouth daily at 12 noon.    [provider]  ferrous sulfate  (FERROUSUL) 325 (65 FE) MG tablet Take 1 tablet (325 mg total) by mouth daily with breakfast. Patient not taking: No sig reported 05/16/19 12/02/20  Taam-Akelman, Rosalea K, MD    Allergies: Patient has no known allergies.    Review of Systems  Gastrointestinal:  Positive for vomiting.    Updated Vital Signs BP 127/86 (BP Location: Right Arm)   Pulse 85   Temp 98.3 F (36.8 C) (Oral)   Resp 20   LMP 07/01/2024   SpO2 99%   Physical Exam Vitals and nursing note reviewed.  Constitutional:      General: She is not in acute distress.    Appearance: She is well-developed.  HENT:     Head: Normocephalic and atraumatic.  Eyes:     Conjunctiva/sclera: Conjunctivae normal.  Cardiovascular:     Rate and Rhythm: Normal rate and regular rhythm.  Heart sounds: No murmur heard. Pulmonary:     Effort: Pulmonary effort is normal. No respiratory distress.     Breath sounds: Normal breath sounds.  Abdominal:     Palpations: Abdomen is soft.     Tenderness: There is no abdominal tenderness. There is no right CVA tenderness, left CVA tenderness, guarding or rebound.  Musculoskeletal:        General: No swelling.     Cervical back: Neck supple.  Skin:    General: Skin is warm and dry.     Capillary Refill: Capillary refill takes less than 2 seconds.  Neurological:     General: No focal deficit present.     Mental Status: She is alert and oriented to person, place, and time.  Psychiatric:         Mood and Affect: Mood normal.     (all labs ordered are listed, but only abnormal results are displayed) Labs Reviewed  CBC WITH DIFFERENTIAL/PLATELET  COMPREHENSIVE METABOLIC PANEL WITH GFR  LIPASE, BLOOD  URINALYSIS, ROUTINE W REFLEX MICROSCOPIC  HEPATITIS PANEL, ACUTE    EKG: None  Radiology: No results found.   Procedures   Medications Ordered in the ED - No data to display                                  Medical Decision Making This patient presents to the ED for concern of nausea vomiting and diarrhea x 2 weeks, elevated LFTs at urgent care, this involves an extensive number of treatment options, and is a complaint that carries with it a high risk of complications and morbidity.  The differential diagnosis includes gastroenteritis, gastritis, hepatitis, other   Co morbidities that complicate the patient evaluation :   none   Additional history obtained:  Additional history obtained from EMR External records from outside source obtained and reviewed including reviewed notes and labs including labs from urgent care today   Lab Tests:  I Ordered, and personally interpreted labs.  The pertinent results include: CBC with no leukocytosis or anemia, CMP with potassium 3.4, AST 192 and and ALT 318, UA with gross blood but no bacteria likely from menstrual cycle.  Patient is not pregnant, hepatitis panel is nonreactive   Imaging Studies ordered:  I ordered imaging studies including right upper quadrant ultrasound which shows acute abnormalities I independently visualized and interpreted imaging within scope of identifying emergent findings  I agree with the radiologist interpretation     Problem List / ED Course / Critical interventions / Medication management  Patient has been having nausea vomiting diarrhea for 2 weeks.  She does not appear ill, vitals are normal but she did have elevated LFTs at urgent care.  Workup was reassuring here she has no  abdominal tenderness or pain, no fevers or chills, no flank pain or urinary symptoms.  She had nausea here with minimal improvement with Zofran , she was then given promethazine  and is feeling much better, able to tolerate p.o. without difficulty.  We discussed her normal ultrasound and labs aside from the transaminitis.  We discussed risks and benefits of CT scan.  Given lack of pain and improvement of symptoms I think it is reasonable to forego CT at this time and she was agreeable with this.  Discussed if she has any recurrence of symptoms and cannot tolerate p.o. at home especially despite medications she would likely need to return and have a CT  scan and further evaluation.  Advised to follow-up closely with GI and PCP for recheck of her LFTs.  She denies drinking alcohol, taking any supplements, drinking energy drinks, denies any significant Tylenol  intake.  She also does not use marijuana or any other illicit drugs to suggest cause of her symptoms.  She denied having any GI bleeding.  I do not feel this is emergent and patient is agreeable with outpatient follow-up.  I ordered medication including Zofran  and promethazine  for nausea  Reevaluation of the patient after these medicines showed that the patient improved I have reviewed the patients home medicines and have made adjustments as needed       Amount and/or Complexity of Data Reviewed Labs: ordered. Radiology: ordered.  Risk Prescription drug management.        Final diagnoses:  None    ED Discharge Orders     None          Suellen Sherran LABOR, PA-C 07/06/24 2155

## 2024-07-06 NOTE — ED Notes (Signed)
Patient verbalizes understanding of discharge instructions. Opportunity for questioning and answers were provided. Armband removed by staff, pt discharged from ED. Ambulated out to lobby  

## 2024-07-06 NOTE — ED Triage Notes (Signed)
 N/v/d x 1 week Pain in left upper abdo Not able to hold down anything Seen at [facility] blood work should elevated liver and sodium

## 2024-07-06 NOTE — Patient Instructions (Signed)
 You have been evaluated at [facility] today.  Your condition, or Chief Complaint are concerning for possible: - Nausea, vomiting and diarrhea - Hypernatremia with sodium of 151 - Acute hepatitis  - Amoxicillin (for OM), Bentyl , Lomotil and Zofran  have already been sent to pharmacy  You have been advised to seek further evaluation  at the nearest Emergency Department. Based upon your condition and location, we recommend:  [facility] at [facility]   (Nephro, kidney stones) [address] [place], [place] [address]

## 2024-07-06 NOTE — ED Notes (Signed)
 Pt aware of the need for a urine... Pt currently unable to provide a sample.SABRASABRASABRA

## 2024-08-04 ENCOUNTER — Ambulatory Visit: Admitting: Allergy

## 2024-08-04 DIAGNOSIS — L501 Idiopathic urticaria: Secondary | ICD-10-CM | POA: Insufficient documentation

## 2024-08-04 DIAGNOSIS — J4520 Mild intermittent asthma, uncomplicated: Secondary | ICD-10-CM | POA: Insufficient documentation

## 2024-08-04 DIAGNOSIS — J300 Vasomotor rhinitis: Secondary | ICD-10-CM | POA: Insufficient documentation

## 2024-08-04 DIAGNOSIS — T783XXD Angioneurotic edema, subsequent encounter: Secondary | ICD-10-CM | POA: Insufficient documentation
